# Patient Record
Sex: Female | Born: 1985 | Race: Black or African American | Hispanic: No | Marital: Married | Smoking: Never smoker
Health system: Southern US, Community
[De-identification: ages and names within clinical notes are randomized; demographics above are authoritative.]

## PROBLEM LIST (undated history)

## (undated) ENCOUNTER — Inpatient Hospital Stay (HOSPITAL_COMMUNITY): Payer: Self-pay

## (undated) ENCOUNTER — Emergency Department (HOSPITAL_COMMUNITY): Admission: EM | Payer: Self-pay | Source: Home / Self Care

## (undated) DIAGNOSIS — D219 Benign neoplasm of connective and other soft tissue, unspecified: Secondary | ICD-10-CM

## (undated) DIAGNOSIS — B977 Papillomavirus as the cause of diseases classified elsewhere: Secondary | ICD-10-CM

## (undated) DIAGNOSIS — R87629 Unspecified abnormal cytological findings in specimens from vagina: Secondary | ICD-10-CM

## (undated) DIAGNOSIS — Z141 Cystic fibrosis carrier: Secondary | ICD-10-CM

## (undated) DIAGNOSIS — K219 Gastro-esophageal reflux disease without esophagitis: Secondary | ICD-10-CM

## (undated) HISTORY — DX: Cystic fibrosis carrier: Z14.1

## (undated) HISTORY — PX: NO PAST SURGERIES: SHX2092

---

## 2009-07-28 ENCOUNTER — Emergency Department (HOSPITAL_COMMUNITY): Admission: EM | Admit: 2009-07-28 | Discharge: 2009-07-28 | Payer: Self-pay | Admitting: Emergency Medicine

## 2012-11-29 LAB — OB RESULTS CONSOLE ABO/RH: RH Type: POSITIVE

## 2012-11-29 LAB — OB RESULTS CONSOLE VARICELLA ZOSTER ANTIBODY, IGG: Varicella: IMMUNE

## 2012-11-29 LAB — OB RESULTS CONSOLE ANTIBODY SCREEN: Antibody Screen: NEGATIVE

## 2012-11-29 LAB — OB RESULTS CONSOLE HGB/HCT, BLOOD
HCT: 41 %
Hemoglobin: 13.7 g/dL

## 2012-11-29 LAB — OB RESULTS CONSOLE PLATELET COUNT: Platelets: 281 10*3/uL

## 2012-12-13 LAB — CYSTIC FIBROSIS DIAGNOSTIC STUDY

## 2012-12-27 DIAGNOSIS — Z141 Cystic fibrosis carrier: Secondary | ICD-10-CM

## 2012-12-27 HISTORY — DX: Cystic fibrosis carrier: Z14.1

## 2013-03-18 ENCOUNTER — Encounter (HOSPITAL_COMMUNITY): Payer: Self-pay | Admitting: *Deleted

## 2013-03-18 ENCOUNTER — Emergency Department (HOSPITAL_COMMUNITY)
Admission: EM | Admit: 2013-03-18 | Discharge: 2013-03-18 | Disposition: A | Discharge status: Home / Self Care | Payer: Medicaid Other | Attending: Emergency Medicine | Admitting: Emergency Medicine

## 2013-03-18 DIAGNOSIS — N760 Acute vaginitis: Secondary | ICD-10-CM | POA: Insufficient documentation

## 2013-03-18 DIAGNOSIS — B9689 Other specified bacterial agents as the cause of diseases classified elsewhere: Secondary | ICD-10-CM

## 2013-03-18 DIAGNOSIS — O4703 False labor before 37 completed weeks of gestation, third trimester: Secondary | ICD-10-CM

## 2013-03-18 DIAGNOSIS — O9989 Other specified diseases and conditions complicating pregnancy, childbirth and the puerperium: Secondary | ICD-10-CM | POA: Insufficient documentation

## 2013-03-18 HISTORY — DX: Benign neoplasm of connective and other soft tissue, unspecified: D21.9

## 2013-03-18 LAB — URINALYSIS, ROUTINE W REFLEX MICROSCOPIC
Glucose, UA: NEGATIVE mg/dL
Ketones, ur: NEGATIVE mg/dL
Leukocytes, UA: NEGATIVE
Protein, ur: NEGATIVE mg/dL
Urobilinogen, UA: 0.2 mg/dL (ref 0.0–1.0)

## 2013-03-18 LAB — URINE MICROSCOPIC-ADD ON

## 2013-03-18 LAB — GC/CHLAMYDIA PROBE AMP: CT Probe RNA: NEGATIVE

## 2013-03-18 LAB — WET PREP, GENITAL: Yeast Wet Prep HPF POC: NONE SEEN

## 2013-03-18 MED ORDER — LACTATED RINGERS IV BOLUS (SEPSIS)
1000.0000 mL | Freq: Once | INTRAVENOUS | Status: AC
  Administered 2013-03-18: 1000 mL via INTRAVENOUS

## 2013-03-18 MED ORDER — METRONIDAZOLE 500 MG PO TABS: 500.0000 mg | ORAL_TABLET | Freq: Two times a day (BID) | ORAL | Status: DC

## 2013-03-18 MED ORDER — METRONIDAZOLE 500 MG PO TABS
500.0000 mg | ORAL_TABLET | Freq: Once | ORAL | Status: AC
  Administered 2013-03-18: 500 mg via ORAL
  Filled 2013-03-18: qty 1

## 2013-03-18 NOTE — ED Notes (Signed)
Ob rn at bedside on fetal monitor.  Iv lr added to saline lok

## 2013-03-18 NOTE — ED Provider Notes (Signed)
CSN: [id number]     Arrival date & time 03/18/13  0434 History     First MD Initiated Contact with Patient 03/18/13 0439     No chief complaint on file.  (Consider location/radiation/quality/duration/timing/severity/associated sxs/prior Treatment) HPI 27 yo female presents to the ER with complaint of abdominal cramping.  Pt is G1P0 at 27 weeks.  She has recently moved to [facility] as of July 1, does not have local OB yet, awaiting call back from [facility].  UTI about a month ago, no symptoms now.  No back pain.  No vaginal bleeding or d/c.  Pt takes daily aspirin for "low blood flow to placenta", also on Vit D.  No other complications during this pregnancy.    No past medical history on file. No past surgical history on file. No family history on file. History  Substance Use Topics  . Smoking status: Not on file  . Smokeless tobacco: Not on file  . Alcohol Use: Not on file   OB History   No data available     Review of Systems  All other systems reviewed and are negative.    Allergies  Review of patient's allergies indicates not on file.  Home Medications  No current outpatient prescriptions on file. BP 109/63  Pulse 83  Temp(Src) 98.9 F (37.2 C) (Oral)  Resp 18  SpO2 96% Physical Exam  Nursing note and vitals reviewed. Constitutional: She is oriented to person, place, and time. She appears well-developed and well-nourished. She appears distressed (tearful, anxious).  HENT:  Head: Normocephalic and atraumatic.  Nose: Nose normal.  Mouth/Throat: Oropharynx is clear and moist.  Eyes: Conjunctivae and EOM are normal. Pupils are equal, round, and reactive to light.  Neck: Normal range of motion. Neck supple. No JVD present. No tracheal deviation present. No thyromegaly present.  Cardiovascular: Normal rate, regular rhythm, normal heart sounds and intact distal pulses.  Exam reveals no gallop and no friction rub.   No murmur heard. Pulmonary/Chest: Effort normal  and breath sounds normal. No stridor. No respiratory distress. She has no wheezes. She has no rales. She exhibits no tenderness.  Abdominal: Soft. Bowel sounds are normal. She exhibits no distension and no mass. There is no tenderness. There is no rebound and no guarding.  Gravid uterus, non tender, good fetal movement appreciated.  Genitourinary:  External genitalia normal Vagina with thick white discharge Cervix closed no lesions, thick No cervical motion tenderness Gravid uterus c/w dates    Musculoskeletal: Normal range of motion. She exhibits no edema and no tenderness.  Lymphadenopathy:    She has no cervical adenopathy.  Neurological: She is alert and oriented to person, place, and time. She exhibits normal muscle tone. Coordination normal.  Skin: Skin is warm and dry. No rash noted. No erythema. No pallor.  Psychiatric: She has a normal mood and affect. Her behavior is normal. Judgment and thought content normal.    ED Course   Procedures (including critical care time)  Labs Reviewed  WET PREP, GENITAL - Abnormal; Notable for the following:    Clue Cells Wet Prep HPF POC FEW (*)    WBC, Wet Prep HPF POC FEW (*)    All other components within normal limits  URINALYSIS, ROUTINE W REFLEX MICROSCOPIC - Abnormal; Notable for the following:    Color, Urine STRAW (*)    Hgb urine dipstick TRACE (*)    All other components within normal limits  URINE MICROSCOPIC-ADD ON - Abnormal; Notable for the  following:    Squamous Epithelial / LPF FEW (*)    Bacteria, UA MANY (*)    All other components within normal limits  GC/CHLAMYDIA PROBE AMP  URINE CULTURE   No results found. 1. Bacterial vaginosis   2. Preterm contractions, third trimester     MDM  27 yo female with abdominal cramping during pregnancy.  Differential includes preterm labor, braxton hicks.  Will check UA, get toco strip, FHT.  6:13 AM Pt having some uterine irritability on toco, no strong contractions.  To  have ivf for possible dehydration.  Pelvic exam completed, possible yeast infection, will sent for wet prep.  Olivia Mackie, MD 03/18/13 [phone]

## 2013-03-18 NOTE — ED Notes (Signed)
Urine not resulted   It was sent almost 2 hours ago.  The  Lab is just now  Running it

## 2013-03-18 NOTE — Progress Notes (Signed)
RROB spoke with Dr Jacquelynn Cree attending) about pt; lab findings, sve, fhr and with ui; Dr agrees with plan of care-finish bolus of fluid, treat pt for BV, pt to be d/c'd to home.

## 2013-03-18 NOTE — ED Notes (Signed)
The pt just moved here from [facility]  She is 7 months preg.  Lmp jan 4th. edc oct 22.  She has had lower abd pain for one  Week no bleeding vaginally .  She has had some sl nausea.  No acute distress at present.  Iv placed.  Ob rapid response called

## 2013-03-19 LAB — URINE CULTURE

## 2013-03-26 ENCOUNTER — Encounter: Payer: Self-pay | Admitting: *Deleted

## 2013-03-29 ENCOUNTER — Encounter: Payer: Self-pay | Admitting: Obstetrics & Gynecology

## 2013-03-30 ENCOUNTER — Encounter: Payer: Self-pay | Admitting: General Practice

## 2013-04-03 ENCOUNTER — Encounter: Payer: Self-pay | Admitting: Obstetrics

## 2013-04-03 ENCOUNTER — Ambulatory Visit (INDEPENDENT_AMBULATORY_CARE_PROVIDER_SITE_OTHER): Payer: Medicaid Other | Admitting: Obstetrics

## 2013-04-03 VITALS — BP 107/62 | Temp 97.8°F | Wt 163.0 lb

## 2013-04-03 DIAGNOSIS — Z3403 Encounter for supervision of normal first pregnancy, third trimester: Secondary | ICD-10-CM

## 2013-04-03 DIAGNOSIS — Z3400 Encounter for supervision of normal first pregnancy, unspecified trimester: Secondary | ICD-10-CM

## 2013-04-03 LAB — POCT URINALYSIS DIPSTICK
Glucose, UA: NEGATIVE
Leukocytes, UA: NEGATIVE
Nitrite, UA: NEGATIVE
Protein, UA: NEGATIVE
Urobilinogen, UA: NEGATIVE

## 2013-04-03 NOTE — Addendum Note (Signed)
Addended by: Julaine Hua on: 04/03/2013 01:28 PM   Modules accepted: Orders

## 2013-04-03 NOTE — Progress Notes (Signed)
Pulse-76   Subjective:    Kim [name] is being seen today for her first obstetrical visit.  This is not a planned pregnancy. She is at [redacted]w[redacted]d gestation. Her obstetrical history is significant for none. Relationship with FOB: spouse, living together. Patient does intend to breast feed. Pregnancy history fully reviewed.  Menstrual History: OB History   Grav Para Term Preterm Abortions TAB SAB Ect Mult Living   1               Menarche age: 27  Patient's last menstrual period was 09/18/2012.    The following portions of the patient's history were reviewed and updated as appropriate: allergies, current medications, past family history, past medical history, past social history, past surgical history and problem list.  Review of Systems Pertinent items are noted in HPI.    Objective:    General appearance: alert and no distress Breasts: normal appearance, no masses or tenderness Abdomen: normal findings: soft, non-tender Pelvic: cervix normal in appearance, external genitalia normal, no adnexal masses or tenderness, no cervical motion tenderness, vagina normal without discharge and uterus enlarged, soft and NT. Extremities: extremities normal, atraumatic, no cyanosis or edema    Assessment:    Pregnancy at [redacted]w[redacted]d weeks    Plan:    Initial labs drawn. Prenatal vitamins.  Counseling provided regarding continued use of seat belts, cessation of alcohol consumption, smoking or use of illicit drugs; infection precautions i.e., influenza/TDAP immunizations, toxoplasmosis,CMV, parvovirus, listeria and varicella; workplace safety, exercise during pregnancy; routine dental care, safe medications, sexual activity, hot tubs, saunas, pools, travel, caffeine use, fish and methlymercury, potential toxins, hair treatments, varicose veins Weight gain recommendations reviewed: underweight/BMI< 18.5--> gain 28 - 40 lbs; normal weight/BMI 18.5 - 24.9--> gain 25 - 35 lbs; overweight/BMI 25 - 29.9-->  gain 15 - 25 lbs; obese/BMI >30->gain  11 - 20 lbs Problem list reviewed and updated. AFP3 discussed: done. Role of ultrasound in pregnancy discussed; fetal survey: done. Amniocentesis discussed: not indicated. Follow up in 2 weeks. 50% of 20 min visit spent on counseling and coordination of care.

## 2013-04-04 ENCOUNTER — Encounter: Payer: Self-pay | Admitting: Obstetrics & Gynecology

## 2013-04-05 ENCOUNTER — Encounter: Payer: Medicaid Other | Admitting: Obstetrics & Gynecology

## 2013-04-05 LAB — URINE CULTURE: Colony Count: 4000

## 2013-04-18 ENCOUNTER — Encounter: Payer: Self-pay | Admitting: Obstetrics

## 2013-04-18 ENCOUNTER — Encounter: Payer: Medicaid Other | Admitting: Obstetrics

## 2013-04-24 ENCOUNTER — Encounter: Payer: Medicaid Other | Admitting: Obstetrics

## 2013-04-25 ENCOUNTER — Ambulatory Visit (INDEPENDENT_AMBULATORY_CARE_PROVIDER_SITE_OTHER): Payer: Medicaid Other | Admitting: Obstetrics

## 2013-04-25 VITALS — BP 115/78 | Temp 98.5°F | Wt 165.6 lb

## 2013-04-25 DIAGNOSIS — Z3400 Encounter for supervision of normal first pregnancy, unspecified trimester: Secondary | ICD-10-CM

## 2013-04-25 DIAGNOSIS — K219 Gastro-esophageal reflux disease without esophagitis: Secondary | ICD-10-CM

## 2013-04-25 DIAGNOSIS — Z3403 Encounter for supervision of normal first pregnancy, third trimester: Secondary | ICD-10-CM

## 2013-04-25 MED ORDER — OMEPRAZOLE 20 MG PO CPDR: 20.0000 mg | DELAYED_RELEASE_CAPSULE | Freq: Every day | ORAL | Status: DC

## 2013-04-25 NOTE — Progress Notes (Signed)
Pulse: 93

## 2013-04-26 ENCOUNTER — Encounter: Payer: Self-pay | Admitting: Obstetrics

## 2013-04-26 ENCOUNTER — Encounter: Payer: Self-pay | Admitting: Obstetrics & Gynecology

## 2013-04-26 DIAGNOSIS — K219 Gastro-esophageal reflux disease without esophagitis: Secondary | ICD-10-CM | POA: Insufficient documentation

## 2013-04-30 ENCOUNTER — Encounter: Payer: Medicaid Other | Admitting: Obstetrics

## 2013-05-01 ENCOUNTER — Encounter: Payer: Self-pay | Admitting: Obstetrics

## 2013-05-01 ENCOUNTER — Encounter: Payer: Medicaid Other | Admitting: Obstetrics

## 2013-05-01 ENCOUNTER — Ambulatory Visit (INDEPENDENT_AMBULATORY_CARE_PROVIDER_SITE_OTHER): Payer: Medicaid Other | Admitting: Obstetrics

## 2013-05-01 VITALS — BP 110/72 | Temp 98.4°F | Wt 165.8 lb

## 2013-05-01 DIAGNOSIS — Z3403 Encounter for supervision of normal first pregnancy, third trimester: Secondary | ICD-10-CM

## 2013-05-01 DIAGNOSIS — Z3400 Encounter for supervision of normal first pregnancy, unspecified trimester: Secondary | ICD-10-CM

## 2013-05-01 NOTE — Progress Notes (Signed)
Patient left before being seen by physician.

## 2013-05-01 NOTE — Progress Notes (Signed)
Pulse: 84

## 2013-05-03 ENCOUNTER — Ambulatory Visit (INDEPENDENT_AMBULATORY_CARE_PROVIDER_SITE_OTHER): Payer: Medicaid Other | Admitting: Obstetrics

## 2013-05-03 ENCOUNTER — Encounter: Payer: Self-pay | Admitting: Obstetrics

## 2013-05-03 VITALS — BP 111/74 | Temp 98.1°F | Wt 167.0 lb

## 2013-05-03 DIAGNOSIS — Z369 Encounter for antenatal screening, unspecified: Secondary | ICD-10-CM

## 2013-05-03 DIAGNOSIS — Z3401 Encounter for supervision of normal first pregnancy, first trimester: Secondary | ICD-10-CM

## 2013-05-03 DIAGNOSIS — Z3400 Encounter for supervision of normal first pregnancy, unspecified trimester: Secondary | ICD-10-CM

## 2013-05-03 NOTE — Addendum Note (Signed)
Addended by: Glendell Docker on: 05/03/2013 11:44 AM   Modules accepted: Orders

## 2013-05-03 NOTE — Progress Notes (Signed)
P 78 Patient states she has some pressure at times with the baby's movement. Patient has hemorrhoids is using  OTC medications.

## 2013-05-08 ENCOUNTER — Other Ambulatory Visit: Payer: Self-pay | Admitting: Obstetrics

## 2013-05-08 ENCOUNTER — Ambulatory Visit (HOSPITAL_COMMUNITY)
Admission: RE | Admit: 2013-05-08 | Discharge: 2013-05-08 | Disposition: A | Discharge status: Home / Self Care | Payer: Medicaid Other | Source: Ambulatory Visit | Attending: Obstetrics | Admitting: Obstetrics

## 2013-05-08 ENCOUNTER — Other Ambulatory Visit (HOSPITAL_COMMUNITY): Payer: Self-pay | Admitting: Maternal and Fetal Medicine

## 2013-05-08 ENCOUNTER — Encounter: Payer: Medicaid Other | Admitting: Obstetrics

## 2013-05-08 DIAGNOSIS — Z369 Encounter for antenatal screening, unspecified: Secondary | ICD-10-CM

## 2013-05-08 DIAGNOSIS — Z3689 Encounter for other specified antenatal screening: Secondary | ICD-10-CM | POA: Insufficient documentation

## 2013-05-08 NOTE — Progress Notes (Signed)
Kim [name]  was seen today for an ultrasound appointment.  See full report in [facility].  Impression: Single IUP at 35 1/7 weeks Somewhat limited views of the fetal anatomy obtained due to late gestational age (outflow tracks, face, spine) The estimated fetal weight today is at the 48th %tile; however, the [facility] measures at the 5th %tile. BPP 8/8 Elevated UA Dopplers noted (> 97.5th %tile) Normal amniotic fluid volume  Recommendations: Based on lagging [facility] and elevated UA Dopplers, recommend 2x weekly NSTs with weekly AFIs/ UA Dopplers. Findings and recommendations discussed with Dr. Clearance Coots who concurs.  Alpha Gula, MD

## 2013-05-10 ENCOUNTER — Other Ambulatory Visit: Payer: Self-pay | Admitting: Obstetrics

## 2013-05-10 DIAGNOSIS — IMO0002 Reserved for concepts with insufficient information to code with codable children: Secondary | ICD-10-CM

## 2013-05-11 ENCOUNTER — Ambulatory Visit (INDEPENDENT_AMBULATORY_CARE_PROVIDER_SITE_OTHER): Payer: Medicaid Other | Admitting: Advanced Practice Midwife

## 2013-05-11 ENCOUNTER — Ambulatory Visit (HOSPITAL_COMMUNITY)
Admission: RE | Admit: 2013-05-11 | Discharge: 2013-05-11 | Disposition: A | Discharge status: Home / Self Care | Payer: Medicaid Other | Source: Ambulatory Visit | Attending: Maternal and Fetal Medicine | Admitting: Maternal and Fetal Medicine

## 2013-05-11 ENCOUNTER — Ambulatory Visit (HOSPITAL_COMMUNITY)
Admission: RE | Admit: 2013-05-11 | Discharge: 2013-05-11 | Disposition: A | Discharge status: Home / Self Care | Payer: Medicaid Other | Source: Ambulatory Visit | Attending: Obstetrics | Admitting: Obstetrics

## 2013-05-11 ENCOUNTER — Other Ambulatory Visit (HOSPITAL_COMMUNITY): Payer: Self-pay | Admitting: Maternal and Fetal Medicine

## 2013-05-11 VITALS — BP 114/73 | Temp 98.7°F | Wt 170.0 lb

## 2013-05-11 DIAGNOSIS — Z3689 Encounter for other specified antenatal screening: Secondary | ICD-10-CM | POA: Insufficient documentation

## 2013-05-11 DIAGNOSIS — O365990 Maternal care for other known or suspected poor fetal growth, unspecified trimester, not applicable or unspecified: Secondary | ICD-10-CM | POA: Insufficient documentation

## 2013-05-11 DIAGNOSIS — Z3400 Encounter for supervision of normal first pregnancy, unspecified trimester: Secondary | ICD-10-CM

## 2013-05-11 NOTE — Progress Notes (Signed)
Patient left prior to appt to go to work. Will reschedule. P 92 Patient states she has RLQ pain at times. Patient states she had an episode of white discharge, but no accompanying itching or irritation.

## 2013-05-15 ENCOUNTER — Encounter: Payer: Self-pay | Admitting: Obstetrics

## 2013-05-15 ENCOUNTER — Ambulatory Visit (HOSPITAL_COMMUNITY)
Admission: RE | Admit: 2013-05-15 | Discharge: 2013-05-15 | Disposition: A | Discharge status: Home / Self Care | Payer: Medicaid Other | Source: Ambulatory Visit | Attending: Obstetrics | Admitting: Obstetrics

## 2013-05-15 ENCOUNTER — Other Ambulatory Visit: Payer: Self-pay | Admitting: Obstetrics

## 2013-05-15 ENCOUNTER — Ambulatory Visit (INDEPENDENT_AMBULATORY_CARE_PROVIDER_SITE_OTHER): Payer: Medicaid Other | Admitting: Obstetrics

## 2013-05-15 VITALS — BP 112/75 | Temp 98.3°F | Wt 169.6 lb

## 2013-05-15 VITALS — BP 124/81 | HR 92 | Wt 171.0 lb

## 2013-05-15 DIAGNOSIS — O365990 Maternal care for other known or suspected poor fetal growth, unspecified trimester, not applicable or unspecified: Secondary | ICD-10-CM | POA: Insufficient documentation

## 2013-05-15 DIAGNOSIS — IMO0002 Reserved for concepts with insufficient information to code with codable children: Secondary | ICD-10-CM

## 2013-05-15 DIAGNOSIS — Z3400 Encounter for supervision of normal first pregnancy, unspecified trimester: Secondary | ICD-10-CM

## 2013-05-15 DIAGNOSIS — Z3403 Encounter for supervision of normal first pregnancy, third trimester: Secondary | ICD-10-CM

## 2013-05-15 LAB — POCT URINALYSIS DIPSTICK
Blood, UA: NEGATIVE
Protein, UA: NEGATIVE
Spec Grav, UA: <=1.005
Urobilinogen, UA: NEGATIVE
pH, UA: 8

## 2013-05-15 NOTE — Progress Notes (Signed)
P 90 Patient was seen at [facility] today for [place] and monitor- patient reports everything normal. Patient states she has a "pinching" pain when she has [place] on R side of abdomen.

## 2013-05-15 NOTE — Progress Notes (Signed)
Kim [name]  was seen today for an ultrasound appointment.  See full report in [facility].  Impression: Single IUP at 35 6/7 weeks Normal UA Doppler studies for gestional age Normal amniotic fluid volume (AFI 20.6 cm) Fetus is Homero Fellers Breech presentation  Recommendations: Continue 2x weekly NSTs with weekly AFI To discuss route of delivery (ECV vs. scheduled C-section) at next OB visit. Follow up ultrasound for growth in 2 weeks.  Alpha Gula, MD

## 2013-05-18 ENCOUNTER — Ambulatory Visit (HOSPITAL_COMMUNITY)
Admission: RE | Admit: 2013-05-18 | Discharge: 2013-05-18 | Disposition: A | Discharge status: Home / Self Care | Payer: Medicaid Other | Source: Ambulatory Visit | Attending: Obstetrics | Admitting: Obstetrics

## 2013-05-18 ENCOUNTER — Other Ambulatory Visit: Payer: Self-pay | Admitting: Obstetrics

## 2013-05-18 ENCOUNTER — Encounter: Payer: Self-pay | Admitting: Obstetrics

## 2013-05-18 DIAGNOSIS — O288 Other abnormal findings on antenatal screening of mother: Secondary | ICD-10-CM

## 2013-05-18 DIAGNOSIS — O365990 Maternal care for other known or suspected poor fetal growth, unspecified trimester, not applicable or unspecified: Secondary | ICD-10-CM | POA: Insufficient documentation

## 2013-05-18 DIAGNOSIS — O289 Unspecified abnormal findings on antenatal screening of mother: Secondary | ICD-10-CM | POA: Insufficient documentation

## 2013-05-18 NOTE — ED Notes (Signed)
Fetal tracing not loading into obix.  Will scan tracing into pts chart when complete.

## 2013-05-21 ENCOUNTER — Ambulatory Visit (HOSPITAL_COMMUNITY): Payer: Medicaid Other

## 2013-05-22 ENCOUNTER — Encounter (HOSPITAL_COMMUNITY): Payer: Self-pay

## 2013-05-22 ENCOUNTER — Other Ambulatory Visit: Payer: Self-pay

## 2013-05-22 ENCOUNTER — Other Ambulatory Visit (HOSPITAL_COMMUNITY): Payer: Self-pay | Admitting: Maternal and Fetal Medicine

## 2013-05-22 ENCOUNTER — Ambulatory Visit (HOSPITAL_COMMUNITY)
Admission: RE | Admit: 2013-05-22 | Discharge: 2013-05-22 | Disposition: A | Discharge status: Home / Self Care | Payer: Medicaid Other | Source: Ambulatory Visit | Attending: Obstetrics | Admitting: Obstetrics

## 2013-05-22 ENCOUNTER — Ambulatory Visit (INDEPENDENT_AMBULATORY_CARE_PROVIDER_SITE_OTHER): Payer: Medicaid Other | Admitting: Obstetrics

## 2013-05-22 VITALS — BP 114/76 | Temp 97.9°F | Wt 169.4 lb

## 2013-05-22 DIAGNOSIS — O321XX0 Maternal care for breech presentation, not applicable or unspecified: Secondary | ICD-10-CM

## 2013-05-22 DIAGNOSIS — O321XX1 Maternal care for breech presentation, fetus 1: Secondary | ICD-10-CM

## 2013-05-22 DIAGNOSIS — Z3403 Encounter for supervision of normal first pregnancy, third trimester: Secondary | ICD-10-CM

## 2013-05-22 DIAGNOSIS — O365990 Maternal care for other known or suspected poor fetal growth, unspecified trimester, not applicable or unspecified: Secondary | ICD-10-CM | POA: Insufficient documentation

## 2013-05-22 DIAGNOSIS — O365931 Maternal care for other known or suspected poor fetal growth, third trimester, fetus 1: Secondary | ICD-10-CM

## 2013-05-22 DIAGNOSIS — IMO0002 Reserved for concepts with insufficient information to code with codable children: Secondary | ICD-10-CM

## 2013-05-22 DIAGNOSIS — Z3689 Encounter for other specified antenatal screening: Secondary | ICD-10-CM | POA: Insufficient documentation

## 2013-05-22 DIAGNOSIS — Z3400 Encounter for supervision of normal first pregnancy, unspecified trimester: Secondary | ICD-10-CM

## 2013-05-22 LAB — POCT URINALYSIS DIPSTICK
Bilirubin, UA: NEGATIVE
Ketones, UA: NEGATIVE
Leukocytes, UA: NEGATIVE
Nitrite, UA: NEGATIVE
Protein, UA: NEGATIVE
Urobilinogen, UA: NEGATIVE
pH, UA: 5

## 2013-05-22 NOTE — Progress Notes (Signed)
Pulse: 88

## 2013-05-25 ENCOUNTER — Ambulatory Visit (HOSPITAL_COMMUNITY): Admission: RE | Admit: 2013-05-25 | Payer: Medicaid Other | Source: Ambulatory Visit

## 2013-05-25 ENCOUNTER — Other Ambulatory Visit: Payer: Self-pay | Admitting: Obstetrics

## 2013-05-25 DIAGNOSIS — IMO0002 Reserved for concepts with insufficient information to code with codable children: Secondary | ICD-10-CM

## 2013-05-29 ENCOUNTER — Ambulatory Visit (HOSPITAL_COMMUNITY): Payer: Medicaid Other

## 2013-05-29 ENCOUNTER — Encounter: Payer: Self-pay | Admitting: Obstetrics

## 2013-05-29 ENCOUNTER — Ambulatory Visit (INDEPENDENT_AMBULATORY_CARE_PROVIDER_SITE_OTHER): Payer: Medicaid Other | Admitting: Obstetrics

## 2013-05-29 ENCOUNTER — Other Ambulatory Visit: Payer: Self-pay | Admitting: Obstetrics

## 2013-05-29 ENCOUNTER — Ambulatory Visit (HOSPITAL_COMMUNITY)
Admission: RE | Admit: 2013-05-29 | Discharge: 2013-05-29 | Disposition: A | Discharge status: Home / Self Care | Payer: Medicaid Other | Source: Ambulatory Visit | Attending: Obstetrics | Admitting: Obstetrics

## 2013-05-29 VITALS — BP 111/78 | Temp 98.2°F | Wt 168.8 lb

## 2013-05-29 DIAGNOSIS — IMO0002 Reserved for concepts with insufficient information to code with codable children: Secondary | ICD-10-CM

## 2013-05-29 DIAGNOSIS — Z3403 Encounter for supervision of normal first pregnancy, third trimester: Secondary | ICD-10-CM

## 2013-05-29 DIAGNOSIS — Z3400 Encounter for supervision of normal first pregnancy, unspecified trimester: Secondary | ICD-10-CM

## 2013-05-29 DIAGNOSIS — O365990 Maternal care for other known or suspected poor fetal growth, unspecified trimester, not applicable or unspecified: Secondary | ICD-10-CM | POA: Insufficient documentation

## 2013-05-29 LAB — POCT URINALYSIS DIPSTICK
Blood, UA: NEGATIVE
Glucose, UA: NEGATIVE
Nitrite, UA: NEGATIVE
Protein, UA: NEGATIVE
Spec Grav, UA: 1.015
Urobilinogen, UA: NEGATIVE
pH, UA: 8

## 2013-05-29 NOTE — Progress Notes (Signed)
Pulse: 76

## 2013-05-30 ENCOUNTER — Encounter: Payer: Self-pay | Admitting: *Deleted

## 2013-05-30 ENCOUNTER — Other Ambulatory Visit: Payer: Self-pay | Admitting: *Deleted

## 2013-06-01 ENCOUNTER — Ambulatory Visit (HOSPITAL_COMMUNITY): Payer: Medicaid Other

## 2013-06-04 ENCOUNTER — Other Ambulatory Visit: Payer: Self-pay | Admitting: Obstetrics

## 2013-06-04 DIAGNOSIS — IMO0002 Reserved for concepts with insufficient information to code with codable children: Secondary | ICD-10-CM

## 2013-06-05 ENCOUNTER — Encounter (HOSPITAL_COMMUNITY): Payer: Self-pay

## 2013-06-05 ENCOUNTER — Other Ambulatory Visit: Payer: Self-pay | Admitting: *Deleted

## 2013-06-05 ENCOUNTER — Ambulatory Visit (HOSPITAL_COMMUNITY)
Admission: RE | Admit: 2013-06-05 | Discharge: 2013-06-05 | Disposition: A | Discharge status: Home / Self Care | Payer: Medicaid Other | Source: Ambulatory Visit | Attending: Obstetrics | Admitting: Obstetrics

## 2013-06-05 ENCOUNTER — Encounter (HOSPITAL_COMMUNITY)
Admission: RE | Admit: 2013-06-05 | Discharge: 2013-06-05 | Disposition: A | Discharge status: Home / Self Care | Payer: Medicaid Other | Source: Ambulatory Visit | Attending: Obstetrics | Admitting: Obstetrics

## 2013-06-05 ENCOUNTER — Encounter: Payer: Self-pay | Admitting: Obstetrics

## 2013-06-05 ENCOUNTER — Encounter (HOSPITAL_COMMUNITY): Payer: Self-pay | Admitting: Pharmacy Technician

## 2013-06-05 ENCOUNTER — Ambulatory Visit (INDEPENDENT_AMBULATORY_CARE_PROVIDER_SITE_OTHER): Payer: Medicaid Other | Admitting: Obstetrics

## 2013-06-05 ENCOUNTER — Encounter: Payer: Self-pay | Admitting: *Deleted

## 2013-06-05 VITALS — BP 118/79 | Temp 97.4°F | Wt 171.0 lb

## 2013-06-05 DIAGNOSIS — Z3400 Encounter for supervision of normal first pregnancy, unspecified trimester: Secondary | ICD-10-CM

## 2013-06-05 DIAGNOSIS — O365990 Maternal care for other known or suspected poor fetal growth, unspecified trimester, not applicable or unspecified: Secondary | ICD-10-CM | POA: Insufficient documentation

## 2013-06-05 DIAGNOSIS — Z3403 Encounter for supervision of normal first pregnancy, third trimester: Secondary | ICD-10-CM

## 2013-06-05 DIAGNOSIS — IMO0002 Reserved for concepts with insufficient information to code with codable children: Secondary | ICD-10-CM

## 2013-06-05 HISTORY — DX: Gastro-esophageal reflux disease without esophagitis: K21.9

## 2013-06-05 LAB — CBC
Hemoglobin: 12.5 g/dL (ref 12.0–15.0)
MCH: 31.4 pg (ref 26.0–34.0)
MCV: 91.2 fL (ref 78.0–100.0)
Platelets: 215 10*3/uL (ref 150–400)
RDW: 13.8 % (ref 11.5–15.5)

## 2013-06-05 NOTE — Patient Instructions (Addendum)
Your procedure is scheduled on: 06/07/2013  Enter through the Maternity Admission of [facility] at: 0600AM  Pick up the phone at the desk and dial [phone].  Call this number if you have problems the morning of surgery: [phone].  Remember: Do NOT eat food:  AFTER MIDNIGHT 06/06/2013 Do NOT drink clear liquids after:  AFTER MIDNIGHT 06/06/2013 Take these medicines the morning of surgery with a SIP OF WATER: PRILOSEC  Do NOT wear jewelry (body piercing), make-up, or nail polish. Do NOT wear lotions, powders, or perfumes.  You may wear deoderant. Do NOT shave for 48 hours prior to surgery. Do NOT bring valuables to the hospital. Contacts, dentures, or bridgework may not be worn into surgery. Leave suitcase in car.  After surgery it may be brought to your room.  For patients admitted to the hospital, checkout time is 11:00 AM the day of discharge.

## 2013-06-05 NOTE — Progress Notes (Signed)
Pulse- 80 

## 2013-06-07 ENCOUNTER — Inpatient Hospital Stay (HOSPITAL_COMMUNITY): Payer: Medicaid Other | Admitting: Anesthesiology

## 2013-06-07 ENCOUNTER — Encounter (HOSPITAL_COMMUNITY): Payer: Self-pay | Admitting: Anesthesiology

## 2013-06-07 ENCOUNTER — Inpatient Hospital Stay (HOSPITAL_COMMUNITY)
Admission: RE | Admit: 2013-06-07 | Discharge: 2013-06-09 | DRG: 766 | Disposition: A | Discharge status: Home / Self Care | Payer: Medicaid Other | Source: Ambulatory Visit | Attending: Obstetrics | Admitting: Obstetrics

## 2013-06-07 ENCOUNTER — Encounter (HOSPITAL_COMMUNITY): Payer: Medicaid Other | Admitting: Anesthesiology

## 2013-06-07 ENCOUNTER — Encounter (HOSPITAL_COMMUNITY)
Admission: RE | Disposition: A | Discharge status: Home / Self Care | Payer: Self-pay | Source: Ambulatory Visit | Attending: Obstetrics

## 2013-06-07 DIAGNOSIS — O321XX0 Maternal care for breech presentation, not applicable or unspecified: Principal | ICD-10-CM | POA: Diagnosis present

## 2013-06-07 SURGERY — Surgical Case
Anesthesia: Spinal | Site: Abdomen | Wound class: Clean Contaminated

## 2013-06-07 MED ORDER — LACTATED RINGERS IV SOLN
INTRAVENOUS | Status: DC
  Administered 2013-06-07 (×2): via INTRAVENOUS

## 2013-06-07 MED ORDER — 0.9 % SODIUM CHLORIDE (POUR BTL) OPTIME
TOPICAL | Status: DC | PRN
  Administered 2013-06-07: 1000 mL

## 2013-06-07 MED ORDER — DIPHENHYDRAMINE HCL 50 MG/ML IJ SOLN
12.5000 mg | INTRAMUSCULAR | Status: DC | PRN
  Administered 2013-06-07: 12.5 mg via INTRAVENOUS
  Filled 2013-06-07: qty 1

## 2013-06-07 MED ORDER — DIPHENHYDRAMINE HCL 25 MG PO CAPS: 25.0000 mg | ORAL_CAPSULE | Freq: Four times a day (QID) | ORAL | Status: DC | PRN

## 2013-06-07 MED ORDER — OXYCODONE-ACETAMINOPHEN 5-325 MG PO TABS
1.0000 | ORAL_TABLET | ORAL | Status: DC | PRN
  Administered 2013-06-07 – 2013-06-08 (×3): 2 via ORAL
  Administered 2013-06-08: 1 via ORAL
  Administered 2013-06-08 – 2013-06-09 (×4): 2 via ORAL
  Filled 2013-06-07: qty 2
  Filled 2013-06-07: qty 1
  Filled 2013-06-07 (×6): qty 2

## 2013-06-07 MED ORDER — NALBUPHINE SYRINGE 5 MG/0.5 ML
5.0000 mg | INJECTION | INTRAMUSCULAR | Status: DC | PRN
  Filled 2013-06-07: qty 1

## 2013-06-07 MED ORDER — SCOPOLAMINE 1 MG/3DAYS TD PT72
1.0000 | MEDICATED_PATCH | Freq: Once | TRANSDERMAL | Status: DC
  Administered 2013-06-07: 1.5 mg via TRANSDERMAL

## 2013-06-07 MED ORDER — PHENYLEPHRINE HCL 10 MG/ML IJ SOLN
INTRAMUSCULAR | Status: AC
  Filled 2013-06-07: qty 1

## 2013-06-07 MED ORDER — PHENYLEPHRINE HCL 10 MG/ML IJ SOLN: 20.0000 mg | INTRAVENOUS | Status: DC | PRN

## 2013-06-07 MED ORDER — SCOPOLAMINE 1 MG/3DAYS TD PT72
MEDICATED_PATCH | TRANSDERMAL | Status: AC
  Administered 2013-06-07: 1.5 mg via TRANSDERMAL
  Filled 2013-06-07: qty 1

## 2013-06-07 MED ORDER — DIPHENHYDRAMINE HCL 25 MG PO CAPS
25.0000 mg | ORAL_CAPSULE | ORAL | Status: DC | PRN
  Administered 2013-06-08 (×2): 25 mg via ORAL
  Filled 2013-06-07 (×2): qty 1

## 2013-06-07 MED ORDER — PROMETHAZINE HCL 25 MG/ML IJ SOLN: 6.2500 mg | INTRAMUSCULAR | Status: DC | PRN

## 2013-06-07 MED ORDER — MORPHINE SULFATE (PF) 0.5 MG/ML IJ SOLN
INTRAMUSCULAR | Status: DC | PRN
  Administered 2013-06-07: .15 mg via INTRATHECAL

## 2013-06-07 MED ORDER — MORPHINE SULFATE 0.5 MG/ML IJ SOLN
INTRAMUSCULAR | Status: AC
  Filled 2013-06-07: qty 10

## 2013-06-07 MED ORDER — SIMETHICONE 80 MG PO CHEW
80.0000 mg | CHEWABLE_TABLET | ORAL | Status: DC | PRN
  Administered 2013-06-08: 80 mg via ORAL
  Filled 2013-06-07: qty 1

## 2013-06-07 MED ORDER — LACTATED RINGERS IV SOLN: Freq: Once | INTRAVENOUS | Status: DC

## 2013-06-07 MED ORDER — ZOLPIDEM TARTRATE 5 MG PO TABS: 5.0000 mg | ORAL_TABLET | Freq: Every evening | ORAL | Status: DC | PRN

## 2013-06-07 MED ORDER — ONDANSETRON HCL 4 MG/2ML IJ SOLN
INTRAMUSCULAR | Status: AC
  Filled 2013-06-07: qty 2

## 2013-06-07 MED ORDER — METOCLOPRAMIDE HCL 5 MG/ML IJ SOLN: 10.0000 mg | Freq: Three times a day (TID) | INTRAMUSCULAR | Status: DC | PRN

## 2013-06-07 MED ORDER — OXYTOCIN 40 UNITS IN LACTATED RINGERS INFUSION - SIMPLE MED: 62.5000 mL/h | INTRAVENOUS | Status: AC

## 2013-06-07 MED ORDER — PHENYLEPHRINE HCL 10 MG/ML IJ SOLN: 10000.0000 ug | INTRAVENOUS | Status: DC | PRN

## 2013-06-07 MED ORDER — PHENYLEPHRINE HCL 10 MG/ML IJ SOLN: 20000.0000 ug | INTRAVENOUS | Status: DC | PRN

## 2013-06-07 MED ORDER — PHENYLEPHRINE 8 MG IN D5W 100 ML (0.08MG/ML) PREMIX OPTIME: INJECTION | INTRAVENOUS | Status: DC | PRN

## 2013-06-07 MED ORDER — MEPERIDINE HCL 25 MG/ML IJ SOLN: 6.2500 mg | INTRAMUSCULAR | Status: DC | PRN

## 2013-06-07 MED ORDER — DIPHENHYDRAMINE HCL 50 MG/ML IJ SOLN: 25.0000 mg | INTRAMUSCULAR | Status: DC | PRN

## 2013-06-07 MED ORDER — PRENATAL MULTIVITAMIN CH
1.0000 | ORAL_TABLET | Freq: Every day | ORAL | Status: DC
  Administered 2013-06-08 – 2013-06-09 (×2): 1 via ORAL
  Filled 2013-06-07 (×2): qty 1

## 2013-06-07 MED ORDER — CLINDAMYCIN PHOSPHATE 900 MG/50ML IV SOLN
900.0000 mg | Freq: Once | INTRAVENOUS | Status: AC
  Administered 2013-06-07: 900 mg via INTRAVENOUS
  Filled 2013-06-07: qty 50

## 2013-06-07 MED ORDER — FENTANYL CITRATE 0.05 MG/ML IJ SOLN
INTRAMUSCULAR | Status: AC
  Filled 2013-06-07: qty 2

## 2013-06-07 MED ORDER — ONDANSETRON HCL 4 MG/2ML IJ SOLN: 4.0000 mg | INTRAMUSCULAR | Status: DC | PRN

## 2013-06-07 MED ORDER — SENNOSIDES-DOCUSATE SODIUM 8.6-50 MG PO TABS
2.0000 | ORAL_TABLET | ORAL | Status: DC
  Administered 2013-06-07 – 2013-06-09 (×2): 2 via ORAL
  Filled 2013-06-07: qty 2

## 2013-06-07 MED ORDER — OXYTOCIN 10 UNIT/ML IJ SOLN
40.0000 [IU] | INTRAVENOUS | Status: DC | PRN
  Administered 2013-06-07: 40 [IU] via INTRAVENOUS

## 2013-06-07 MED ORDER — KETOROLAC TROMETHAMINE 30 MG/ML IJ SOLN
30.0000 mg | Freq: Once | INTRAMUSCULAR | Status: AC
  Administered 2013-06-07: 30 mg via INTRAVENOUS
  Filled 2013-06-07: qty 1

## 2013-06-07 MED ORDER — IBUPROFEN 600 MG PO TABS
600.0000 mg | ORAL_TABLET | Freq: Four times a day (QID) | ORAL | Status: DC
  Administered 2013-06-07 – 2013-06-09 (×7): 600 mg via ORAL
  Filled 2013-06-07 (×7): qty 1

## 2013-06-07 MED ORDER — SIMETHICONE 80 MG PO CHEW
80.0000 mg | CHEWABLE_TABLET | Freq: Three times a day (TID) | ORAL | Status: DC
  Administered 2013-06-08 – 2013-06-09 (×4): 80 mg via ORAL
  Filled 2013-06-07 (×4): qty 1

## 2013-06-07 MED ORDER — DIBUCAINE 1 % RE OINT: 1.0000 "application " | TOPICAL_OINTMENT | RECTAL | Status: DC | PRN

## 2013-06-07 MED ORDER — FENTANYL CITRATE 0.05 MG/ML IJ SOLN
INTRAMUSCULAR | Status: DC | PRN
  Administered 2013-06-07: 25 ug via INTRATHECAL

## 2013-06-07 MED ORDER — OXYCODONE-ACETAMINOPHEN 5-325 MG PO TABS
2.0000 | ORAL_TABLET | Freq: Once | ORAL | Status: AC
  Administered 2013-06-07: 2 via ORAL
  Filled 2013-06-07 (×2): qty 2

## 2013-06-07 MED ORDER — SIMETHICONE 80 MG PO CHEW
80.0000 mg | CHEWABLE_TABLET | ORAL | Status: DC
Start: 2013-06-08 — End: 2013-06-09
  Administered 2013-06-07 – 2013-06-09 (×2): 80 mg via ORAL
  Filled 2013-06-07 (×2): qty 1

## 2013-06-07 MED ORDER — SODIUM CHLORIDE 0.9 % IJ SOLN: 3.0000 mL | INTRAMUSCULAR | Status: DC | PRN

## 2013-06-07 MED ORDER — OXYTOCIN 10 UNIT/ML IJ SOLN
INTRAMUSCULAR | Status: AC
  Filled 2013-06-07: qty 4

## 2013-06-07 MED ORDER — NALBUPHINE SYRINGE 5 MG/0.5 ML
5.0000 mg | INJECTION | INTRAMUSCULAR | Status: DC | PRN
  Administered 2013-06-07 (×2): 5 mg via INTRAVENOUS
  Administered 2013-06-07: 10 mg via INTRAVENOUS
  Administered 2013-06-08 (×2): 5 mg via INTRAVENOUS
  Filled 2013-06-07 (×4): qty 1

## 2013-06-07 MED ORDER — LANOLIN HYDROUS EX OINT: 1.0000 "application " | TOPICAL_OINTMENT | CUTANEOUS | Status: DC | PRN

## 2013-06-07 MED ORDER — HYDROMORPHONE HCL PF 1 MG/ML IJ SOLN: 0.2500 mg | INTRAMUSCULAR | Status: DC | PRN

## 2013-06-07 MED ORDER — NALOXONE HCL 0.4 MG/ML IJ SOLN: 0.4000 mg | INTRAMUSCULAR | Status: DC | PRN

## 2013-06-07 MED ORDER — PHENYLEPHRINE HCL 10 MG/ML IJ SOLN
INTRAMUSCULAR | Status: DC | PRN
  Administered 2013-06-07: 80 ug via INTRAVENOUS

## 2013-06-07 MED ORDER — PHENYLEPHRINE 40 MCG/ML (10ML) SYRINGE FOR IV PUSH (FOR BLOOD PRESSURE SUPPORT)
PREFILLED_SYRINGE | INTRAVENOUS | Status: AC
  Filled 2013-06-07: qty 5

## 2013-06-07 MED ORDER — NALOXONE HCL 1 MG/ML IJ SOLN
1.0000 ug/kg/h | INTRAVENOUS | Status: DC | PRN
  Filled 2013-06-07: qty 2

## 2013-06-07 MED ORDER — ONDANSETRON HCL 4 MG/2ML IJ SOLN
INTRAMUSCULAR | Status: DC | PRN
  Administered 2013-06-07: 4 mg via INTRAMUSCULAR

## 2013-06-07 MED ORDER — ONDANSETRON HCL 4 MG PO TABS: 4.0000 mg | ORAL_TABLET | ORAL | Status: DC | PRN

## 2013-06-07 MED ORDER — TETANUS-DIPHTH-ACELL PERTUSSIS 5-2.5-18.5 LF-MCG/0.5 IM SUSP
0.5000 mL | Freq: Once | INTRAMUSCULAR | Status: AC
  Administered 2013-06-09: 0.5 mL via INTRAMUSCULAR
  Filled 2013-06-07 (×2): qty 0.5

## 2013-06-07 MED ORDER — LACTATED RINGERS IV SOLN
INTRAVENOUS | Status: DC
  Administered 2013-06-07 (×3): via INTRAVENOUS

## 2013-06-07 MED ORDER — PHENYLEPHRINE HCL 10 MG/ML IJ SOLN
20.0000 mg | INTRAVENOUS | Status: DC | PRN
  Administered 2013-06-07: 60 ug/min via INTRAVENOUS

## 2013-06-07 MED ORDER — MENTHOL 3 MG MT LOZG: 1.0000 | LOZENGE | OROMUCOSAL | Status: DC | PRN

## 2013-06-07 MED ORDER — ONDANSETRON HCL 4 MG/2ML IJ SOLN: 4.0000 mg | Freq: Three times a day (TID) | INTRAMUSCULAR | Status: DC | PRN

## 2013-06-07 MED ORDER — WITCH HAZEL-GLYCERIN EX PADS: 1.0000 "application " | MEDICATED_PAD | CUTANEOUS | Status: DC | PRN

## 2013-06-07 SURGICAL SUPPLY — 44 items
BARRIER ADHS 3X4 INTERCEED (GAUZE/BANDAGES/DRESSINGS) IMPLANT
CANISTER WOUND CARE 500ML ATS (WOUND CARE) IMPLANT
CLAMP CORD UMBIL (MISCELLANEOUS) IMPLANT
CLOTH BEACON ORANGE TIMEOUT ST (SAFETY) ×2 IMPLANT
CONTAINER PREFILL 10% NBF 15ML (MISCELLANEOUS) ×4 IMPLANT
DERMABOND ADVANCED (GAUZE/BANDAGES/DRESSINGS) ×1
DERMABOND ADVANCED .7 DNX12 (GAUZE/BANDAGES/DRESSINGS) ×1 IMPLANT
DRAPE LG THREE QUARTER DISP (DRAPES) ×4 IMPLANT
DRSG OPSITE POSTOP 4X10 (GAUZE/BANDAGES/DRESSINGS) ×2 IMPLANT
DRSG VAC ATS LRG SENSATRAC (GAUZE/BANDAGES/DRESSINGS) IMPLANT
DRSG VAC ATS MED SENSATRAC (GAUZE/BANDAGES/DRESSINGS) IMPLANT
DRSG VAC ATS SM SENSATRAC (GAUZE/BANDAGES/DRESSINGS) IMPLANT
DURAPREP 26ML APPLICATOR (WOUND CARE) ×2 IMPLANT
ELECT REM PT RETURN 9FT ADLT (ELECTROSURGICAL) ×2
ELECTRODE REM PT RTRN 9FT ADLT (ELECTROSURGICAL) ×1 IMPLANT
EXTRACTOR VACUUM M CUP 4 TUBE (SUCTIONS) IMPLANT
GLOVE BIO SURGEON STRL SZ8 (GLOVE) ×4 IMPLANT
GOWN PREVENTION PLUS XLARGE (GOWN DISPOSABLE) ×2 IMPLANT
GOWN STRL REIN XL XLG (GOWN DISPOSABLE) ×4 IMPLANT
KIT ABG SYR 3ML LUER SLIP (SYRINGE) IMPLANT
NEEDLE HYPO 25X5/8 SAFETYGLIDE (NEEDLE) ×2 IMPLANT
NS IRRIG 1000ML POUR BTL (IV SOLUTION) ×2 IMPLANT
PACK C SECTION WH (CUSTOM PROCEDURE TRAY) ×2 IMPLANT
PAD OB MATERNITY 4.3X12.25 (PERSONAL CARE ITEMS) ×2 IMPLANT
RTRCTR C-SECT PINK 25CM LRG (MISCELLANEOUS) ×2 IMPLANT
STAPLER VISISTAT 35W (STAPLE) IMPLANT
SUT GUT PLAIN 0 CT-3 TAN 27 (SUTURE) IMPLANT
SUT MNCRL 0 VIOLET CTX 36 (SUTURE) ×3 IMPLANT
SUT MNCRL AB 4-0 PS2 18 (SUTURE) IMPLANT
SUT MON AB 2-0 CT1 27 (SUTURE) ×2 IMPLANT
SUT MON AB 3-0 SH 27 (SUTURE)
SUT MON AB 3-0 SH27 (SUTURE) IMPLANT
SUT MONOCRYL 0 CTX 36 (SUTURE) ×3
SUT PDS AB 0 CTX 60 (SUTURE) IMPLANT
SUT PLAIN 2 0 XLH (SUTURE) IMPLANT
SUT VIC AB 0 CTX 36 (SUTURE)
SUT VIC AB 0 CTX36XBRD ANBCTRL (SUTURE) IMPLANT
SUT VIC AB 2-0 CT1 27 (SUTURE)
SUT VIC AB 2-0 CT1 TAPERPNT 27 (SUTURE) IMPLANT
SUT VIC AB 3-0 SH 27 (SUTURE) ×1
SUT VIC AB 3-0 SH 27X BRD (SUTURE) ×1 IMPLANT
TOWEL OR 17X24 6PK STRL BLUE (TOWEL DISPOSABLE) ×2 IMPLANT
TRAY FOLEY CATH 14FR (SET/KITS/TRAYS/PACK) ×2 IMPLANT
WATER STERILE IRR 1000ML POUR (IV SOLUTION) ×2 IMPLANT

## 2013-06-07 NOTE — Anesthesia Procedure Notes (Signed)

## 2013-06-07 NOTE — Lactation Note (Signed)
This note was copied from the chart of Kim [name]. Lactation Consultation Note  Patient Name: Kim [name] [id number] Date: 06/07/2013 Reason for consult: Initial assessment Baby sleepy at this visit, but has not latched yet. On exam, Mom has aerola edema and short nipple shaft. No colostrum with hand expression. Demonstrated reverse pressure massage and how to use hand pump to help with latch. Attempted to latch baby but she was not interested, would take few suckles on my finger but sleepy. BF basics reviewed with Mom. Encouraged to BF with feeding ques, keep baby STS when awake. Lactation brochure left for review, advised of OP services and support group. Advised Mom to call for assist with feedings till baby is latching. Pre-pump as discussed. Left with baby STS on Mom.  Maternal Data Formula Feeding for Exclusion: No Infant to breast within first hour of birth: No Breastfeeding delayed due to:: Maternal status Has patient been taught Hand Expression?: Yes Does the patient have breastfeeding experience prior to this delivery?: No  Feeding Feeding Type: Breast Fed Length of feed: 0 min  LATCH Score/Interventions Latch: Too sleepy or reluctant, no latch achieved, no sucking elicited. Intervention(s): Skin to skin;Teach feeding cues  Audible Swallowing: None  Type of Nipple: Everted at rest and after stimulation (aerola edema, short nipple shaft)  Comfort (Breast/Nipple): Soft / non-tender     Hold (Positioning): Assistance needed to correctly position infant at breast and maintain latch. Intervention(s): Breastfeeding basics reviewed;Support Pillows;Position options;Skin to skin  LATCH Score: 5  Lactation Tools Discussed/Used Tools: Pump Breast pump type: Manual   Consult Status Consult Status: Follow-up Date: 06/08/13 Follow-up type: In-patient    Alfred Levins 06/07/2013, 11:01 PM

## 2013-06-07 NOTE — H&P (Signed)
Kim [name] is a 27 y.o. female presenting for C/S. Maternal Medical History:  Reason for admission: 27 yo G1.  EDC 06-13-13.  Presents for C/S for breech presentation.  Fetal activity: Perceived fetal activity is normal.   Last perceived fetal movement was within the past hour.    Prenatal complications: no prenatal complications Prenatal Complications - Diabetes: none.    OB History   Grav Para Term Preterm Abortions TAB SAB Ect Mult Living   1              Past Medical History  Diagnosis Date  . Fibroid     1.9cm  . 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 Anxiety   . GERD (gastroesophageal reflux disease)    Past Surgical History  Procedure Laterality Date  . No past surgeries     Family History: family history is not on file. Social History:  reports that she has never smoked. She does not have any smokeless tobacco history on file. She reports that she drinks alcohol. Her drug history is not on file.   Prenatal Transfer Tool  Maternal Diabetes: No Genetic Screening: Normal Maternal Ultrasounds/Referrals: Normal Fetal Ultrasounds or other Referrals:  None Maternal Substance Abuse:  No Significant Maternal Medications:  None Significant Maternal Lab Results:  None Other Comments:  None  Review of Systems  All other systems reviewed and are negative.      Blood pressure 110/77, pulse 100, temperature 98.2 F (36.8 C), temperature source Oral, resp. rate 18, last menstrual period 08/26/2012, SpO2 98.00%. Maternal Exam:  Abdomen: Patient reports no abdominal tenderness. Fetal presentation: breech     Physical Exam  Nursing note and vitals reviewed. Constitutional: She is oriented to person, place, and time. She appears well-developed and well-nourished.  HENT:  Head: Normocephalic and atraumatic.  Eyes: Conjunctivae are normal. Pupils are equal, round, and reactive to light.  Neck: Normal range of motion. Neck supple.  Cardiovascular: Normal rate and  regular rhythm.   Respiratory: Effort normal.  GI: Soft.  Musculoskeletal: Normal range of motion.  Neurological: She is alert and oriented to person, place, and time.  Skin: Skin is warm and dry.  Psychiatric: She has a normal mood and affect. Her behavior is normal. Judgment and thought content normal.    Prenatal labs: ABO, Rh: --/--/O POS, O POS (10/14 1145) Antibody: NEG (10/14 1145) Rubella: Immune (04/09 0000) RPR: NON REACTIVE (10/14 1145)  HBsAg:    HIV: Non-reactive (04/09 0000)  GBS:     Assessment/Plan: 39 weeks.  Breech presentation.  C/S planned.   HARPER,CHARLES A 06/07/2013, 6:46 AM

## 2013-06-07 NOTE — Op Note (Signed)
Cesarean Section Procedure Note   Kim [name]   06/07/2013  Indications: Breech Presentation   Pre-operative Diagnosis: breech.   Post-operative Diagnosis: Same   Surgeon: Coral Ceo A  Assistants: Francoise Ceo  Anesthesia: spinal  Procedure Details:  The patient was seen in the Holding Room. The risks, benefits, complications, treatment options, and expected outcomes were discussed with the patient. The patient concurred with the proposed plan, giving informed consent. The patient was identified as [name] and the procedure verified as C-Section Delivery. A Time Out was held and the above information confirmed.  After induction of anesthesia, the patient was draped and prepped in the usual sterile manner. A transverse incision was made and carried down through the subcutaneous tissue to the fascia. The fascial incision was made and extended transversely. The fascia was separated from the underlying rectus tissue superiorly and inferiorly. The peritoneum was identified and entered. The peritoneal incision was extended longitudinally. The utero-vesical peritoneal reflection was incised transversely and the bladder flap was bluntly freed from the lower uterine segment. A low transverse uterine incision was made. Delivered from cephalic presentation was a 2665 gram living newborn female infant(s). APGAR (1 MIN): 7   APGAR (5 MINS): 9   APGAR (10 MINS):    A cord ph was not sent. The umbilical cord was clamped and cut cord. A sample was obtained for evaluation. The placenta was removed Intact and appeared normal.  The uterine incision was closed with running locked sutures of 1-0 Monocryl. A second imbricating layer of the same suture was placed.  Hemostasis was observed. The paracolic gutters were irrigated. The parieto peritoneum was closed in a running fashion with 2-0 Vicryl.  The fascia was then reapproximated with running sutures of 0 Vicryl.  The skin was closed with staples.   Instrument, sponge, and needle counts were correct prior the abdominal closure and were correct at the conclusion of the case.    Findings:   Estimated Blood Loss: * No blood loss amount entered *   Total IV Fluids:   Urine Output: 200CC OF clear urine  Specimens: @ORSPECIMEN @   Complications: no complications  Disposition: PACU - hemodynamically stable.  Maternal Condition: stable   Baby condition / location:  Baby placed on mother skin to skin immediately post op, then to Newborn Nursery per protocol in good condition.    Signed: Surgeon(s): Brock Bad, MD Kathreen Cosier, MD

## 2013-06-07 NOTE — Anesthesia Preprocedure Evaluation (Addendum)
Anesthesia Evaluation  Patient identified by MRN, date of birth, ID band Patient awake    Reviewed: Allergy & Precautions, H&P , NPO status , Patient's Chart, lab work & pertinent test results  Airway Mallampati: IV TM Distance: >3 FB Neck ROM: full  Mouth opening: Limited Mouth Opening  Dental no notable dental hx.    Pulmonary neg pulmonary ROS,    Pulmonary exam normal       Cardiovascular negative cardio ROS      Neuro/Psych negative neurological ROS     GI/Hepatic negative GI ROS, Neg liver ROS,   Endo/Other  negative endocrine ROS  Renal/GU negative Renal ROS     Musculoskeletal negative musculoskeletal ROS (+)   Abdominal Normal abdominal exam  (+)   Peds  Hematology negative hematology ROS (+)   Anesthesia Other Findings   Reproductive/Obstetrics (+) Pregnancy                          Anesthesia Physical Anesthesia Plan  ASA: II  Anesthesia Plan: Spinal   Post-op Pain Management:    Induction:   Airway Management Planned:   Additional Equipment:   Intra-op Plan:   Post-operative Plan:   Informed Consent: I have reviewed the patients History and Physical, chart, labs and discussed the procedure including the risks, benefits and alternatives for the proposed anesthesia with the patient or authorized representative who has indicated his/her understanding and acceptance.     Plan Discussed with: CRNA and Surgeon  Anesthesia Plan Comments:         Anesthesia Quick Evaluation

## 2013-06-07 NOTE — Anesthesia Postprocedure Evaluation (Signed)
  Anesthesia Post-op Note  Patient: Kim [name]  Procedure(s) Performed: Procedure(s): PRIMARY CESAREAN SECTION (N/A)  Patient Location: PACU and Mother/Baby  Anesthesia Type:Spinal  Level of Consciousness: awake, alert , oriented and patient cooperative  Airway and Oxygen Therapy: Patient Spontanous Breathing  Post-op Pain: none  Post-op Assessment: Post-op Vital signs reviewed, Patient's Cardiovascular Status Stable and Respiratory Function Stable  Post-op Vital Signs: Reviewed and stable  Complications: No apparent anesthesia complications

## 2013-06-07 NOTE — Transfer of Care (Signed)
Immediate Anesthesia Transfer of Care Note  Patient: Kim [name]  Procedure(s) Performed: Procedure(s): PRIMARY CESAREAN SECTION (N/A)  Patient Location: PACU  Anesthesia Type:Spinal  Level of Consciousness: awake, alert  and oriented  Airway & Oxygen Therapy: Patient Spontanous Breathing  Post-op Assessment: Report given to PACU RN and Post -op Vital signs reviewed and stable  Post vital signs: stable  Complications: No apparent anesthesia complications

## 2013-06-08 ENCOUNTER — Encounter (HOSPITAL_COMMUNITY): Payer: Self-pay | Admitting: Obstetrics

## 2013-06-08 LAB — CBC
HCT: 30.2 % — ABNORMAL LOW (ref 36.0–46.0)
MCH: 32.3 pg (ref 26.0–34.0)
MCHC: 35.1 g/dL (ref 30.0–36.0)
MCV: 92.1 fL (ref 78.0–100.0)
Platelets: 171 10*3/uL (ref 150–400)
RDW: 13.9 % (ref 11.5–15.5)

## 2013-06-08 NOTE — Progress Notes (Signed)
Subjective: Postpartum Day 1: Cesarean Delivery Patient reports tolerating PO and no problems voiding.    Objective: Vital signs in last 24 hours: Temp:  [98.1 F (36.7 C)-99.4 F (37.4 C)] 98.3 F (36.8 C) (10/17 0542) Pulse Rate:  [60-85] 67 (10/17 0542) Resp:  [17-21] 18 (10/17 0542) BP: (78-132)/(49-80) 101/59 mmHg (10/17 0542) SpO2:  [95 %-100 %] 96 % (10/17 0542)  Physical Exam:  General: alert and no distress Lochia: appropriate Uterine Fundus: firm Incision: healing well DVT Evaluation: No evidence of DVT seen on physical exam.   Recent Labs  06/05/13 1145 06/08/13 0635  HGB 12.5 10.6*  HCT 36.3 30.2*    Assessment/Plan: Status post Cesarean section. Doing well postoperatively.  Continue current care.  Zorah Backes A 06/08/2013, 8:48 AM

## 2013-06-08 NOTE — Progress Notes (Signed)
UR chart review completed.  

## 2013-06-09 ENCOUNTER — Ambulatory Visit: Payer: Self-pay

## 2013-06-09 MED ORDER — OXYCODONE-ACETAMINOPHEN 5-325 MG PO TABS: 1.0000 | ORAL_TABLET | ORAL | Status: DC | PRN

## 2013-06-09 MED ORDER — NORETHINDRONE 0.35 MG PO TABS: 1.0000 | ORAL_TABLET | Freq: Every day | ORAL | Status: DC

## 2013-06-09 NOTE — Plan of Care (Signed)
Problem: Discharge Progression Outcomes Goal: Remove staples per MD order Outcome: Not Applicable Date Met:  06/09/13 To return to MD office on 06/14/13 for staple removal

## 2013-06-09 NOTE — Lactation Note (Signed)
This note was copied from the chart of Kim [name]. Lactation Consultation Note  Patient Name: Kim [name] [id number] Date: 06/09/2013 Reason for consult: Follow-up assessment;Infant < 6lbs;Difficult latch Mom reports baby is not sustaining a latch. They have been supplementing with formula using a curved tipped syringe, 7.5 ml each feeding. Parents are packed and ready to go home. Mom declined to try and latch baby at this visit. She reports she will pump and bottle feed for now. Advised parents they need to increase supplement to 20-25 ml each feeding, increasing per guidelines discussed and by the end of 1st week baby should take 45-60 ml each feeding. WIC loaner pump rental done for Mom to have pump to use till she gets to [facility] next week. Encouraged to schedule OP follow up if she would like to attempt to get baby back to the breast. Engorgement care reviewed if needed. Observed FOB give 1st bottle, baby took 7 ml. Parents are ready to go and report they will feed baby when they get home.    Maternal Data    Feeding Feeding Type: Bottle Fed - Formula  LATCH Score/Interventions                      Lactation Tools Discussed/Used Tools: Pump Breast pump type: Double-Electric Breast Pump WIC Program: Yes Pump Review: Milk Storage;Setup, frequency, and cleaning   Consult Status Consult Status: Complete Date: 06/09/13 Follow-up type: In-patient    Alfred Levins 06/09/2013, 3:40 PM

## 2013-06-09 NOTE — Discharge Summary (Signed)
  Obstetric Discharge Summary Reason for Admission: cesarean section Prenatal Procedures: none Intrapartum Procedures: cesarean: low cervical, transverse Postpartum Procedures: none Complications-Operative and Postpartum: none  Hemoglobin  Date Value Range Status  06/08/2013 10.6* 12.0 - 15.0 g/dL Final  0/04/8118 14.7   Final     HCT  Date Value Range Status  06/08/2013 30.2* 36.0 - 46.0 % Final  11/29/2012 41   Final    Physical Exam:  General: alert Lochia: appropriate Uterine: firm Incision: clean, dry and intact DVT Evaluation: No evidence of DVT seen on physical exam.  Discharge Diagnoses: Active Problems:   Cesarean delivery delivered   Discharge Information: Date: 06/09/2013 Activity: pelvic rest Diet: routine Medications:  Prior to Admission medications   Medication Sig Start Date End Date Taking? Authorizing Provider  Cholecalciferol (VITAMIN D) 2000 UNITS CAPS Take 2,000 Units by mouth daily.   Yes Historical Provider, MD  Omega-3 Fatty Acids (PRENATAL OMEGA BABY PO) Take 3 capsules by mouth daily.    Yes Historical Provider, MD  Prenatal Vit-Fe Fumarate-FA (PRENATAL MULTIVITAMIN) TABS tablet Take 1 tablet by mouth daily at 12 noon.   Yes Historical Provider, MD  norethindrone (ORTHO MICRONOR) 0.35 MG tablet Take 1 tablet (0.35 mg total) by mouth daily. 06/09/13   Antionette Char, MD  oxyCODONE-acetaminophen (PERCOCET/ROXICET) 5-325 MG per tablet Take 1-2 tablets by mouth every 4 (four) hours as needed. 06/09/13   Antionette Char, MD    Condition: stable Instructions: refer to routine discharge instructions Discharge to: home Follow-up Information   Follow up with HARPER,CHARLES A, MD. Schedule an appointment as soon as possible for a visit on 06/14/2013.   Specialty:  Obstetrics and Gynecology   Contact information:   [address] [place] [address] [phone]       Newborn Data:  Live born female  Birth Weight: 5 lb 14  oz (2665 g) APGAR: 7, 9   Home with mother.  JACKSON-MOORE,Kaide Gage A 06/09/2013, 9:55 AM

## 2013-06-13 ENCOUNTER — Ambulatory Visit: Payer: Medicaid Other | Admitting: Obstetrics

## 2013-06-14 ENCOUNTER — Ambulatory Visit (INDEPENDENT_AMBULATORY_CARE_PROVIDER_SITE_OTHER): Payer: Medicaid Other | Admitting: Obstetrics

## 2013-06-14 ENCOUNTER — Encounter: Payer: Self-pay | Admitting: Obstetrics

## 2013-06-14 DIAGNOSIS — N390 Urinary tract infection, site not specified: Secondary | ICD-10-CM

## 2013-06-14 DIAGNOSIS — R52 Pain, unspecified: Secondary | ICD-10-CM

## 2013-06-14 LAB — POCT URINALYSIS DIPSTICK
Blood, UA: NEGATIVE
Glucose, UA: NEGATIVE
Spec Grav, UA: 1.015
Urobilinogen, UA: NEGATIVE
pH, UA: 6

## 2013-06-14 MED ORDER — TRAMADOL HCL 50 MG PO TABS: 50.0000 mg | ORAL_TABLET | Freq: Four times a day (QID) | ORAL | Status: DC | PRN

## 2013-06-14 MED ORDER — IBUPROFEN 800 MG PO TABS: 800.0000 mg | ORAL_TABLET | Freq: Three times a day (TID) | ORAL | Status: DC | PRN

## 2013-06-14 MED ORDER — DOCUSATE SODIUM 100 MG PO CAPS: 24.0000 mg | ORAL_CAPSULE | Freq: Two times a day (BID) | ORAL | Status: DC | PRN

## 2013-06-14 NOTE — Progress Notes (Signed)
Subjective:     Kim [name] is a 27 y.o. female who presents for a postpartum visit. She is 1 week postpartum following a low cervical transverse Cesarean section. I have fully reviewed the prenatal and intrapartum course. The delivery was at 39.1 gestational weeks. Outcome: primary cesarean section, low transverse incision. Anesthesia: spinal. Postpartum course has been normal. Baby's course - baby is in PICU at [facility]- trouble breathing during feeding. Baby is feeding by breast. Bleeding thin lochia. Bowel function is some constipation. Bladder function is normal. Patient is not sexually active. Contraception method is oral progesterone-only contraceptive. Postpartum depression screening: negative.  The following portions of the patient's history were reviewed and updated as appropriate: allergies, current medications, past family history, past medical history, past social history, past surgical history and problem list.  Review of Systems Pertinent items are noted in HPI.   Objective:    BP 116/83  Pulse 78  Temp(Src) 98.8 F (37.1 C)  Wt 166 lb (75.297 kg)  BMI 27.62 kg/m2  LMP 08/26/2012  Breastfeeding? Yes  General:  alert   Breasts:  inspection negative, no nipple discharge or bleeding, no masses or nodularity palpable Abdomen:  Soft, NT.  Staples removed and steri strips applied.   Assessment:     Normal postpartum exam. Pap smear not done at today's visit.   Plan:    1. Contraception: abstinence 2. Contraceptive options discussed. 3. Follow up in: 2 weeks or as needed.

## 2013-06-28 ENCOUNTER — Other Ambulatory Visit: Payer: Self-pay

## 2013-06-28 ENCOUNTER — Ambulatory Visit: Payer: Medicaid Other | Admitting: Obstetrics

## 2013-07-05 ENCOUNTER — Ambulatory Visit (INDEPENDENT_AMBULATORY_CARE_PROVIDER_SITE_OTHER): Payer: Medicaid Other | Admitting: Obstetrics

## 2013-07-05 ENCOUNTER — Encounter: Payer: Self-pay | Admitting: Obstetrics

## 2013-07-05 NOTE — Progress Notes (Signed)
Subjective:     Kim [name] is a 27 y.o. female who presents for a postpartum visit. She is 2 weeks postpartum following a low cervical transverse Cesarean section. I have fully reviewed the prenatal and intrapartum course. The delivery was at 39.1 gestational weeks. Outcome: primary cesarean section, low transverse incision. Anesthesia: spinal. Postpartum course has been normal. 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 Baby is feeding by breast. Bleeding staining only. Bowel function is normal. Bladder function is normal. Patient is not sexually active. Contraception method is oral progesterone-only contraceptive. Postpartum depression screening: negative.  The following portions of the patient's history were reviewed and updated as appropriate: allergies, current medications, past family history, past medical history, past social history, past surgical history and problem list.  Review of Systems Pertinent items are noted in HPI.   Objective:    BP 112/80  Pulse 67  Temp(Src) 97.8 F (36.6 C)  Ht 5\' 5"  (1.651 m)  Wt 155 lb (70.308 kg)  BMI 25.79 kg/m2  LMP 08/26/2012  Breastfeeding? Yes  General:  alert and no distress   Breasts:  inspection negative, no nipple discharge or bleeding, no masses or nodularity palpable Abdomen:  Incision C, D, I and NT.                                   Assessment:     Normal postpartum exam. Pap smear not done at today's visit.   Plan:    1. Contraception: abstinence 2. Contraceptive options discussed. 3. Follow up in: 4 weeks or as needed.

## 2013-08-02 ENCOUNTER — Ambulatory Visit: Payer: Medicaid Other | Admitting: Obstetrics

## 2013-08-06 ENCOUNTER — Ambulatory Visit: Payer: Self-pay | Admitting: Obstetrics

## 2013-08-27 ENCOUNTER — Ambulatory Visit: Payer: Medicaid Other | Admitting: Obstetrics

## 2013-08-31 ENCOUNTER — Ambulatory Visit (INDEPENDENT_AMBULATORY_CARE_PROVIDER_SITE_OTHER): Payer: Medicaid Other | Admitting: Obstetrics

## 2013-08-31 ENCOUNTER — Encounter: Payer: Self-pay | Admitting: Obstetrics

## 2013-08-31 DIAGNOSIS — Z3009 Encounter for other general counseling and advice on contraception: Secondary | ICD-10-CM

## 2013-08-31 MED ORDER — NORGESTIM-ETH ESTRAD TRIPHASIC 0.18/0.215/0.25 MG-25 MCG PO TABS: 1.0000 | ORAL_TABLET | Freq: Every day | ORAL | Status: DC

## 2013-08-31 NOTE — Progress Notes (Signed)
Subjective:     Kim [name] is a 28 y.o. female who presents for a postpartum visit. She is 12 weeks postpartum following a low cervical transverse Cesarean section. I have fully reviewed the prenatal and intrapartum course. The delivery was at 39.1 gestational weeks. Outcome: primary cesarean section, low transverse incision. Anesthesia: spinal. Postpartum course has been going well. Baby's course has been going well. Baby is feeding by bottle - [employer]- gentle. Bleeding thick, heavy lochia. Bowel function is normal. Bladder function is normal. Patient is sexually active. Contraception method is OCP (estrogen/progesterone). Postpartum depression screening: negative. Patient states that with current birth control periods are irregular and would like to discuss other options. Patient has previously used triclycline and was happy with that.   The following portions of the patient's history were reviewed and updated as appropriate: allergies, current medications, past family history, past medical history, past social history, past surgical history and problem list.  Review of Systems Pertinent items are noted in HPI.   Objective:    BP 124/77  Pulse 90  Temp(Src) 98.2 F (36.8 C)  Ht 5\' 5"  (1.651 m)  Wt 153 lb (69.4 kg)  BMI 25.46 kg/m2  LMP 08/26/2013  Breastfeeding? No  General:  alert and no distress   Breasts:  inspection negative, no nipple discharge or bleeding, no masses or nodularity palpable Abdomen:  Soft, NT.  Incision C, D, I.   Assessment:     Normal postpartum exam. Pap smear not done at today's visit.    Contraceptive counseling done.  Plan:    1. Contraception: OCP (estrogen/progesterone) 2. Continue PNV's 3. Follow up in: several months or as needed.

## 2013-12-31 ENCOUNTER — Ambulatory Visit: Payer: Self-pay | Admitting: Obstetrics

## 2014-06-24 ENCOUNTER — Encounter: Payer: Self-pay | Admitting: Obstetrics

## 2014-07-08 IMAGING — US US FETAL BPP W/O NONSTRESS
1 series · 9 of 9 positions shown · non-contrast
Comparison: none

[Series 1: us fetal bpp w/o nonstress · 0.28mm/px · 9 acquisitions, 9 frames shown]
[im 1/9]
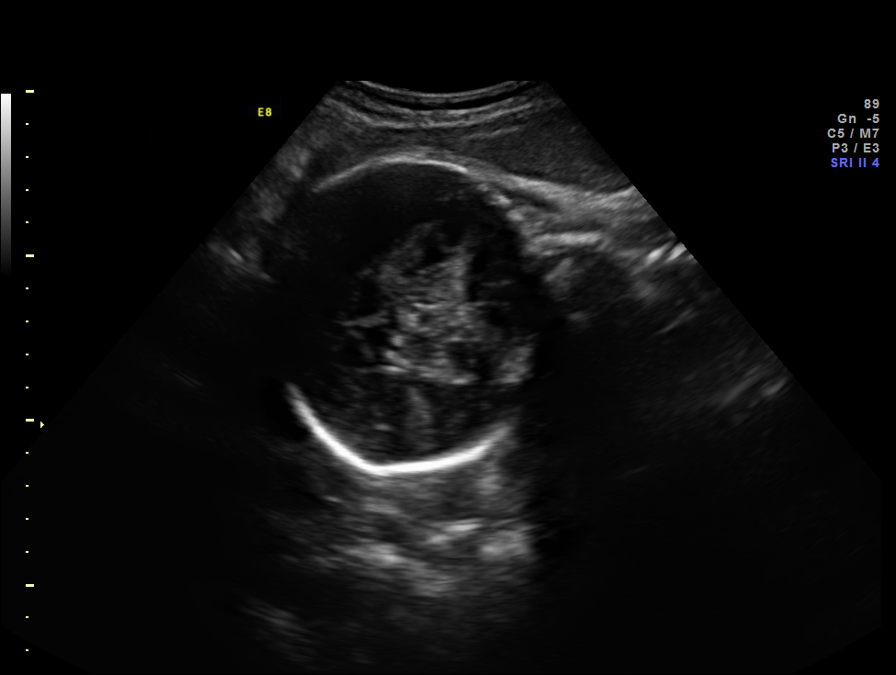
[im 2/9]
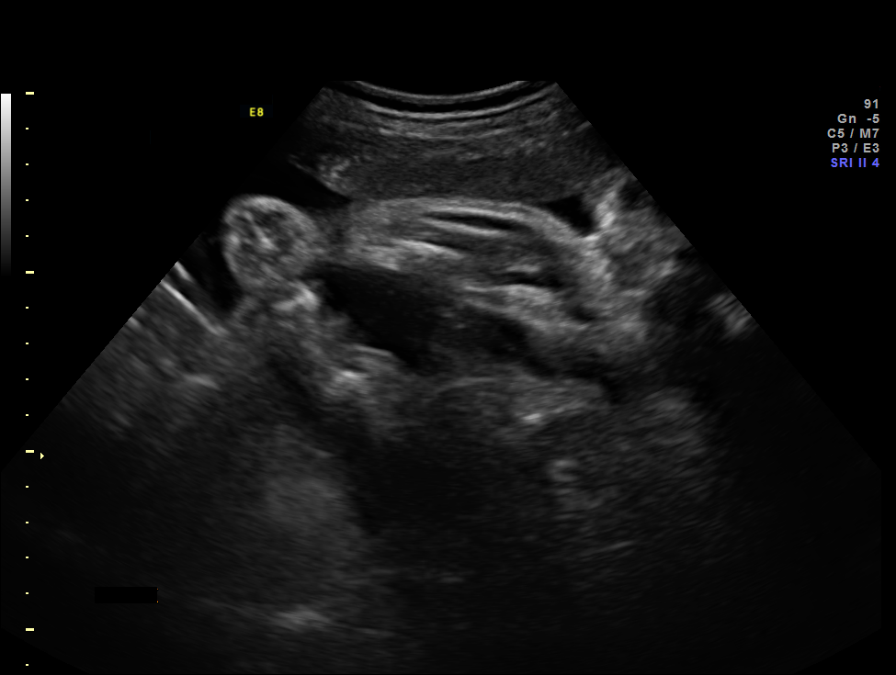
[im 3/9]
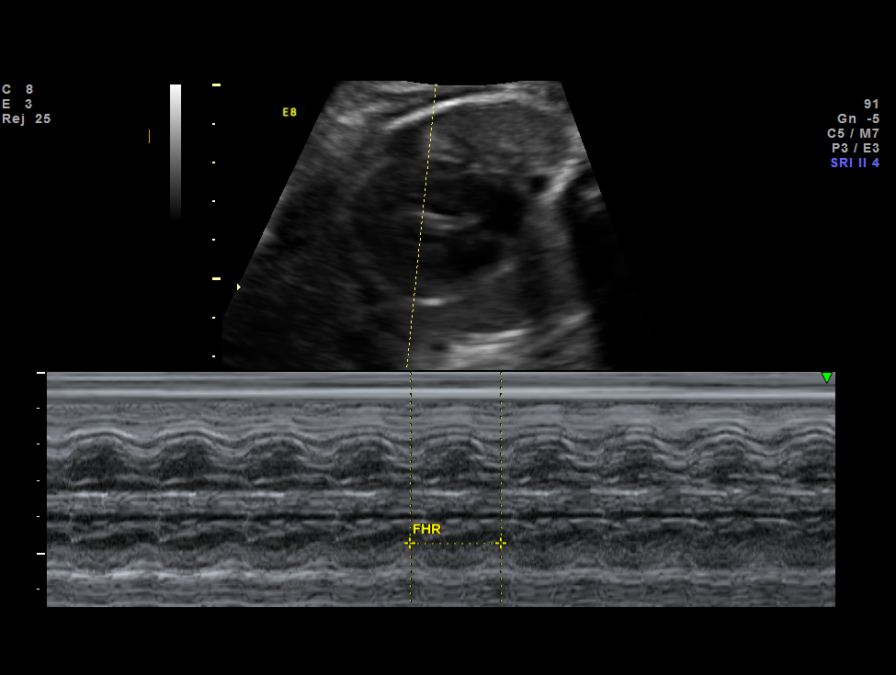
[im 4/9]
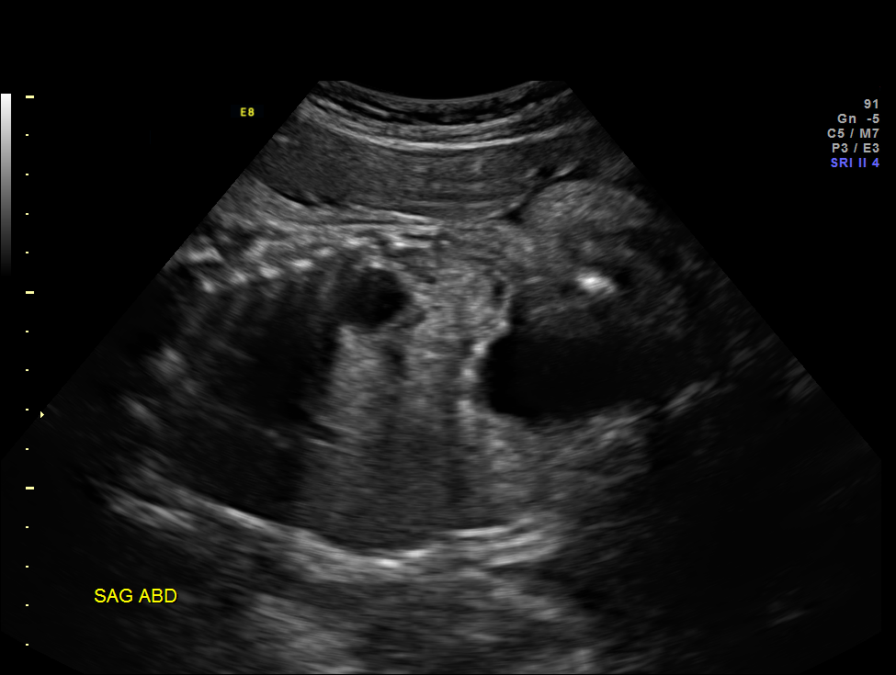
[im 5/9]
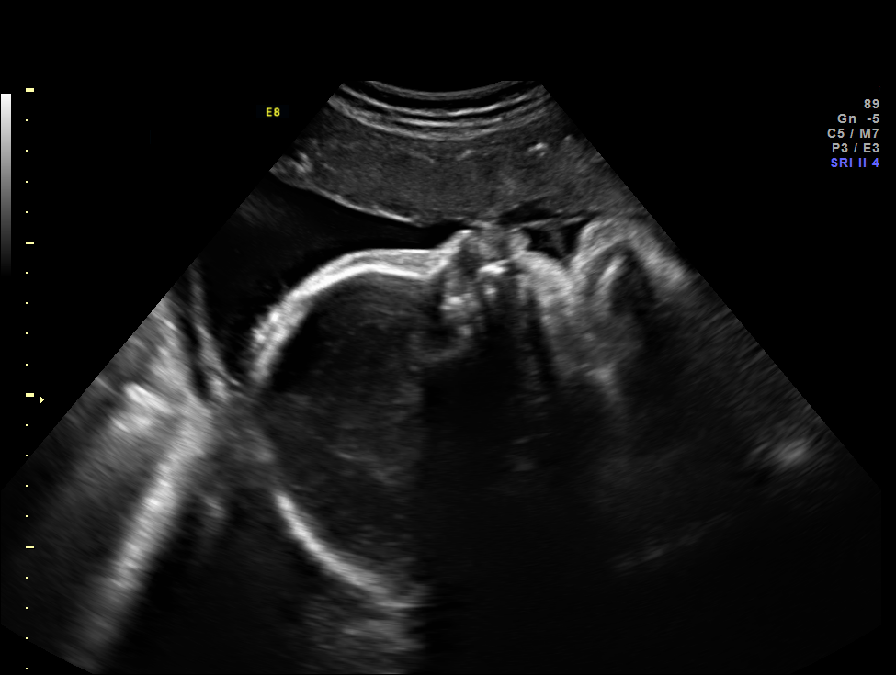
[im 6/9]
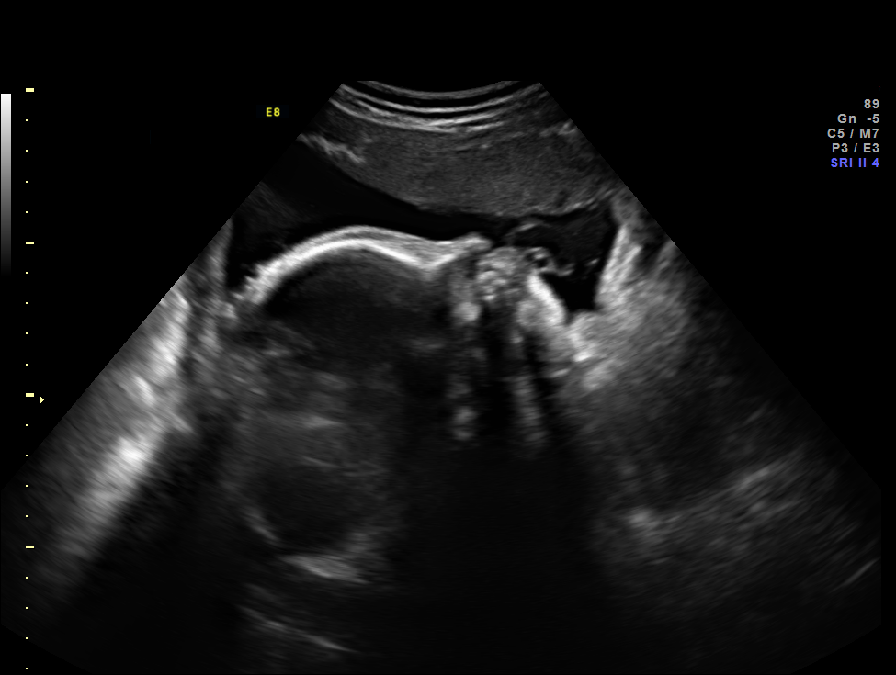
[im 7/9]
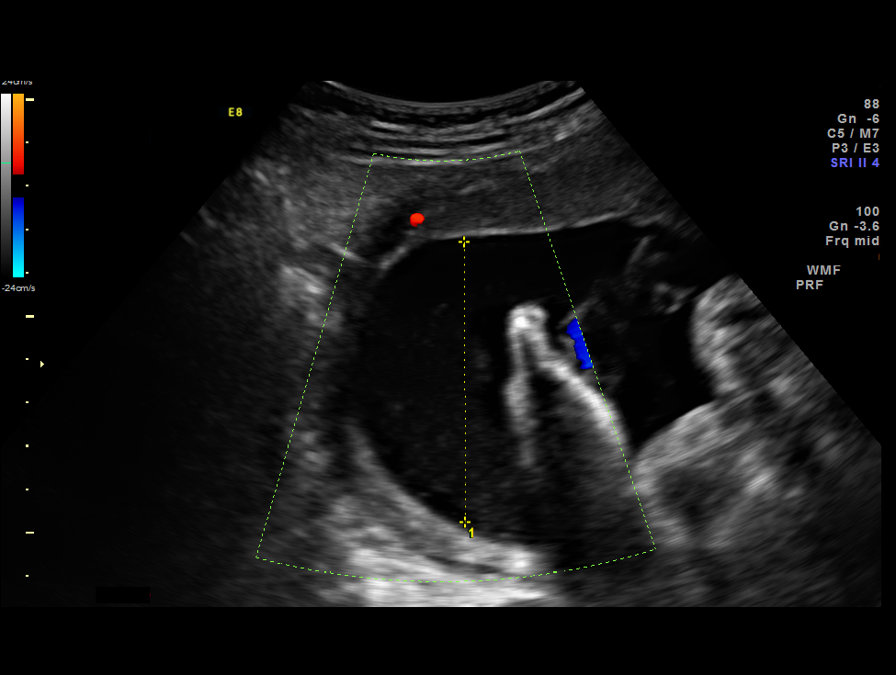
[im 8/9]
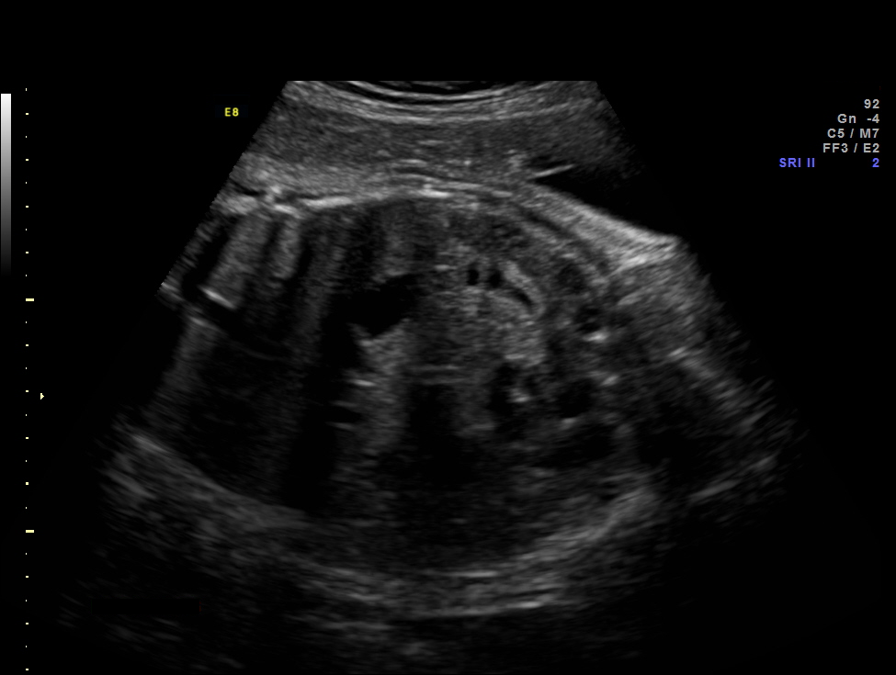
[im 9/9]
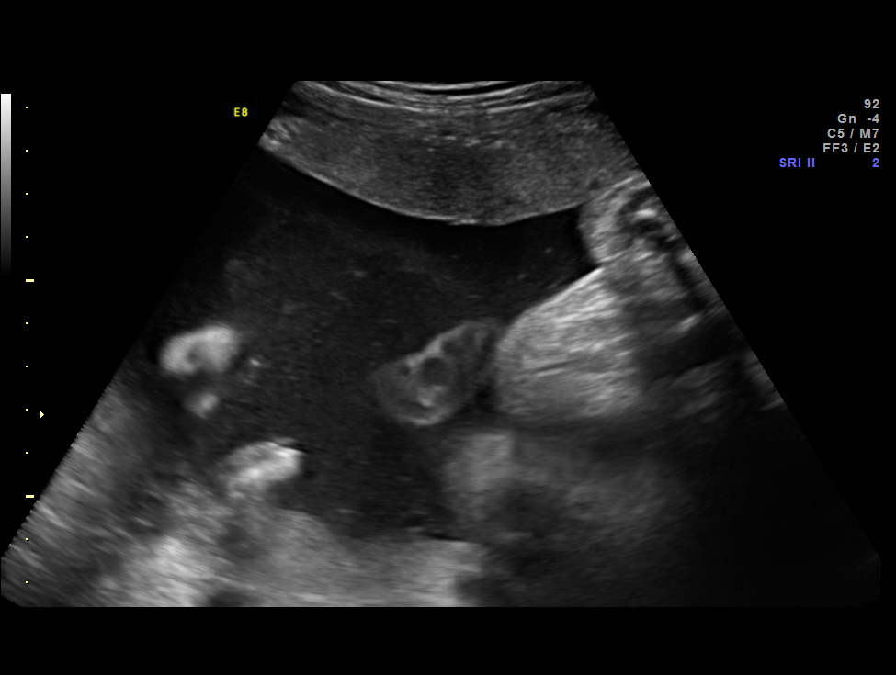

[9 of 9 positions shown; findings below may reference images not displayed]

OBSTETRICS REPORT
                      (Signed Final 05/18/2013 [DATE])

Service(s) Provided

Indications

 Size less than dates; lagging AC: 5th %tile
 Non-reactive NST
Fetal Evaluation

 Num Of Fetuses:    1
 Fetal Heart Rate:  136                          bpm
 Cardiac Activity:  Observed
 Presentation:      Frank breech
 Placenta:          Anterior, above cervical os
 P. Cord            Previously Visualized
 Insertion:

 Amniotic Fluid
 AFI FV:      Subjectively within normal limits
                                             Larg Pckt:     6.4  cm
Biophysical Evaluation

 Amniotic F.V:   Pocket => 2 cm two         F. Tone:        Observed
                 planes
 F. Movement:    Observed                   Score:          [DATE]
 F. Breathing:   Observed
Gestational Age

 Best:          36w 2d     Det. By:  Early Ultrasound         EDD:   06/13/13
Impression

 IUP at 36+2 weeks
 Normal amniotic fluid volume
 BPP [DATE]
Recommendations

 Continue twice weekly NSTs with weekly AFIs
 questions or concerns.

## 2014-07-12 IMAGING — US US UA CORD DOPPLER
1 series · 9 of 9 positions shown · non-contrast
Comparison: none

[Series 1: us ua cord doppler · 9 of 9 slices shown]
[im 1/9]
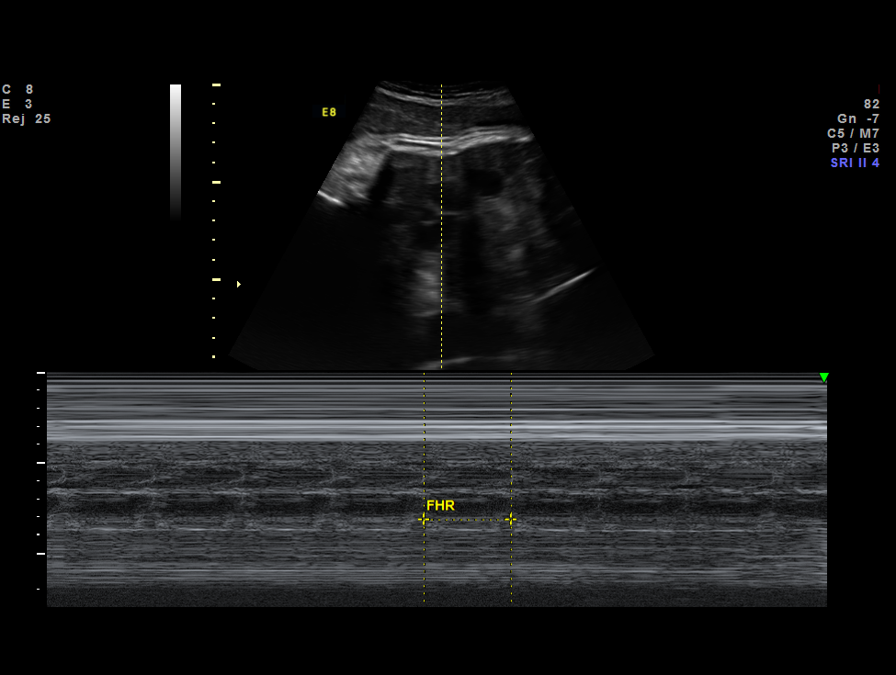
[im 2/9]
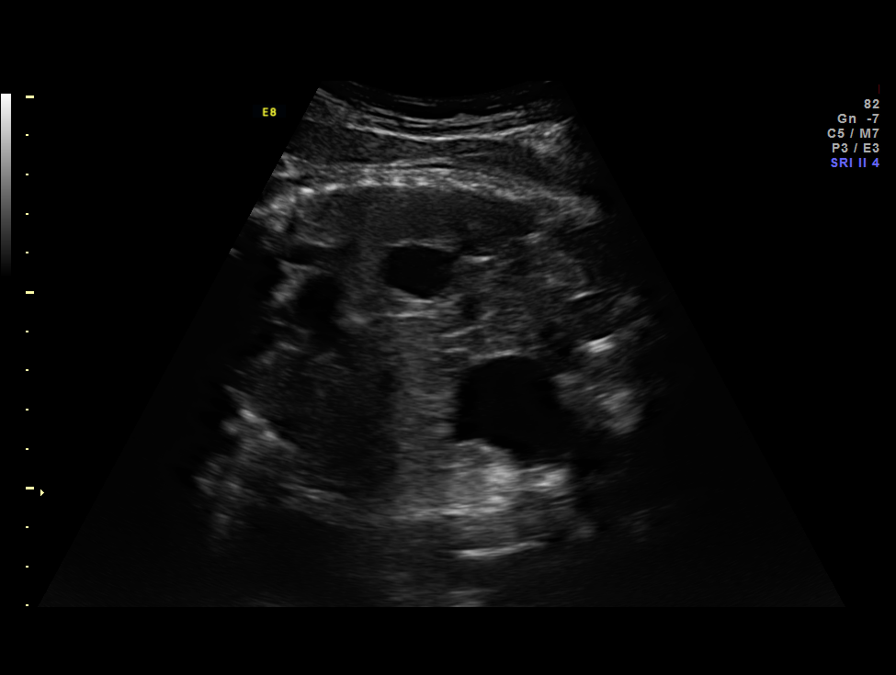
[im 3/9]
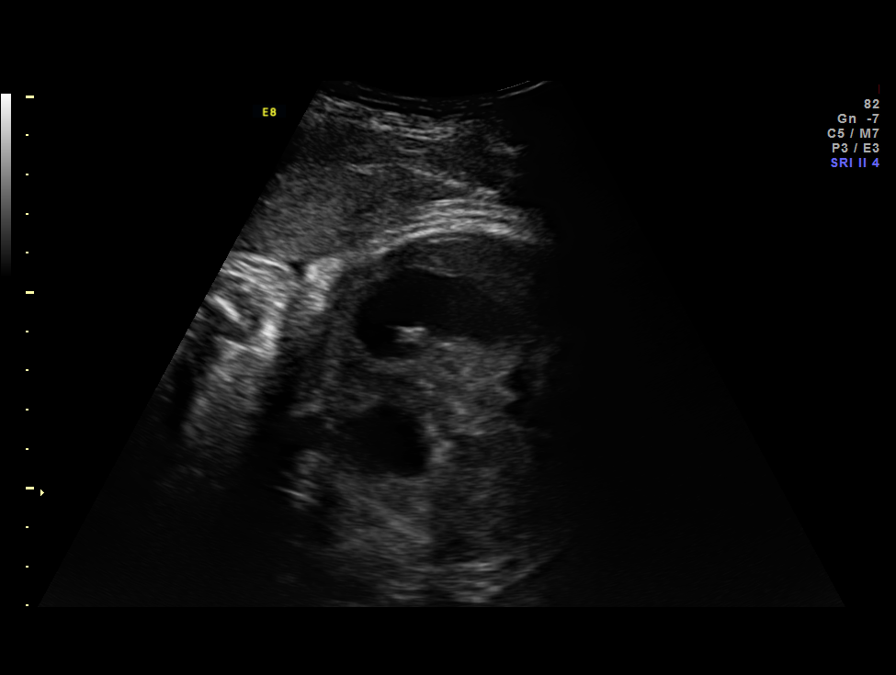
[im 4/9]
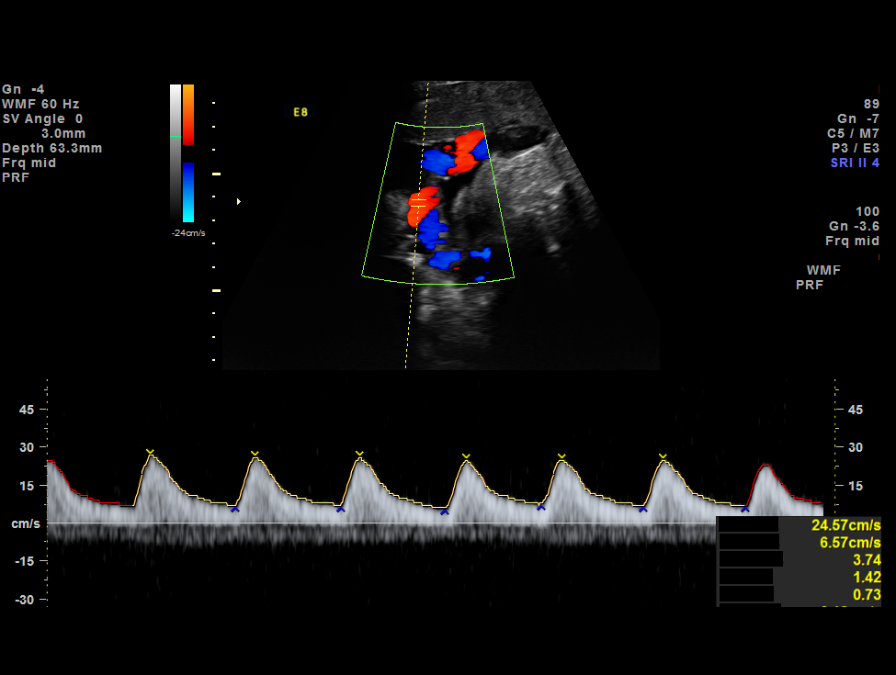
[im 5/9]
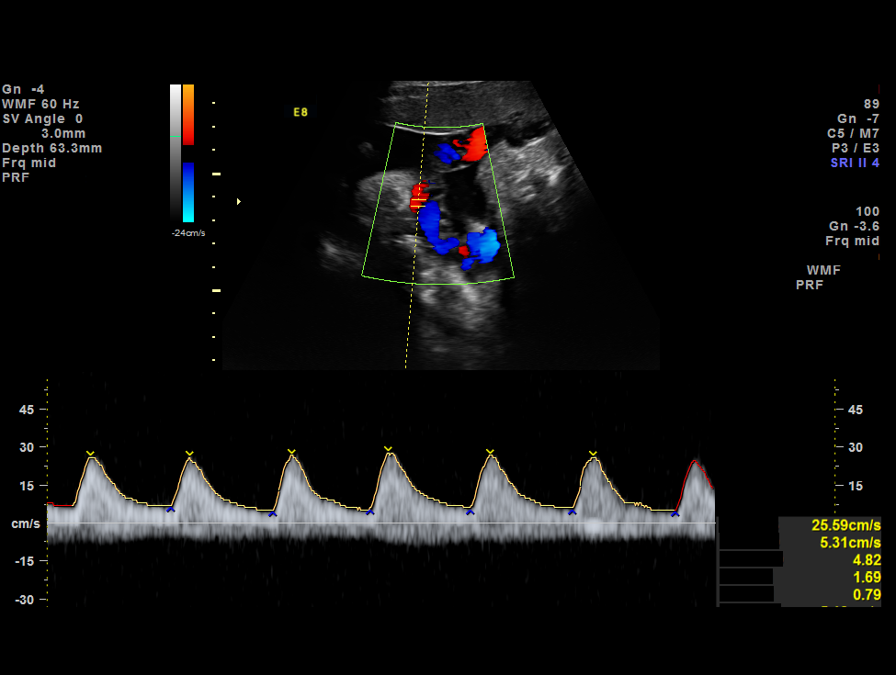
[im 6/9]
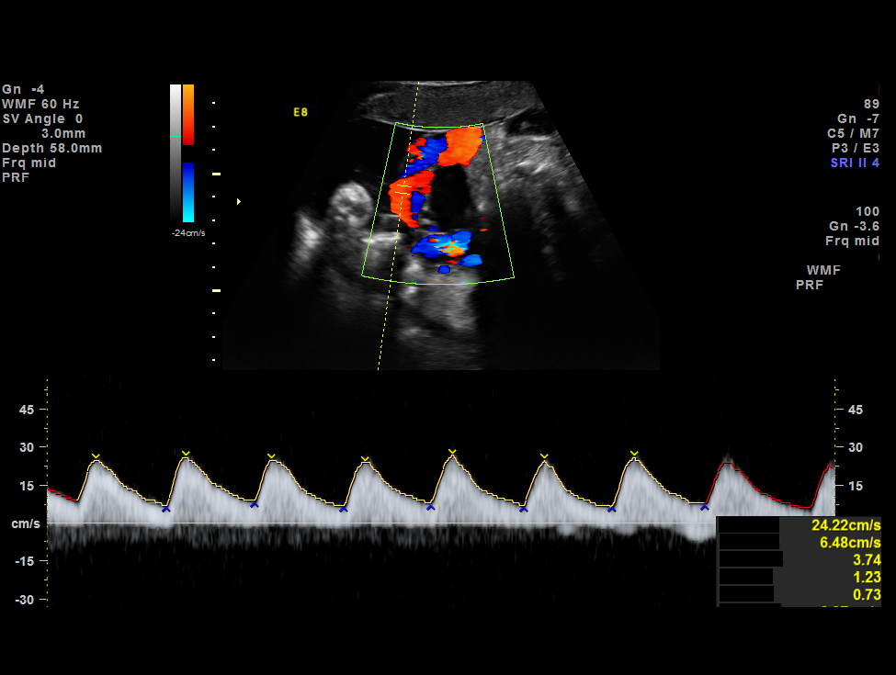
[im 7/9]
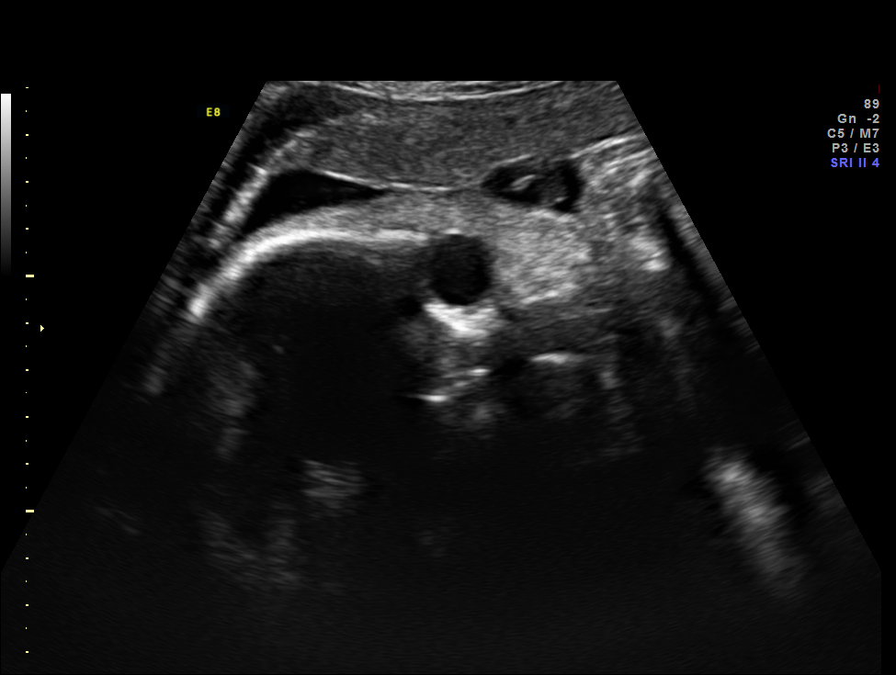
[im 8/9]
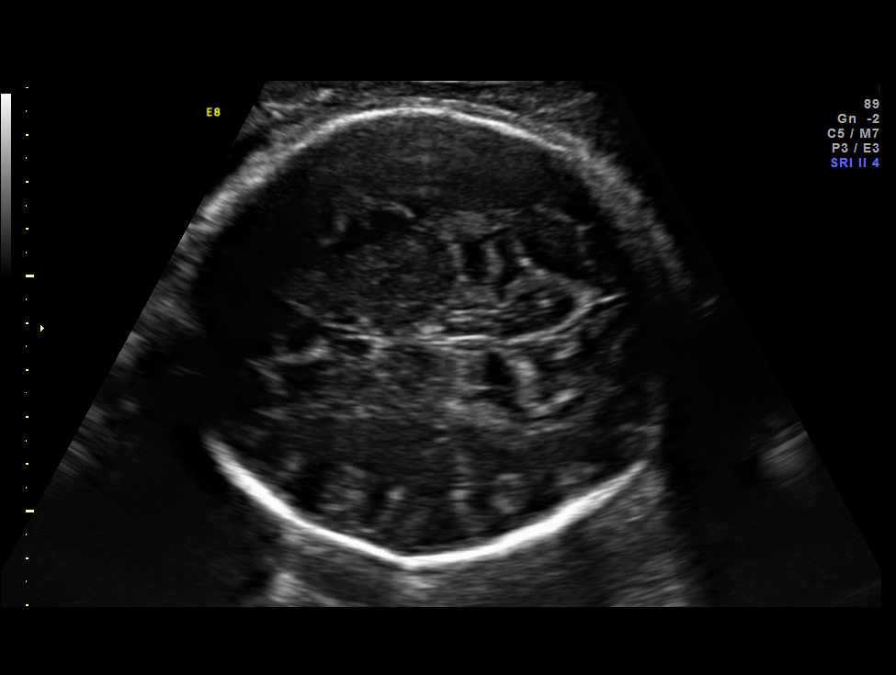
[im 9/9]
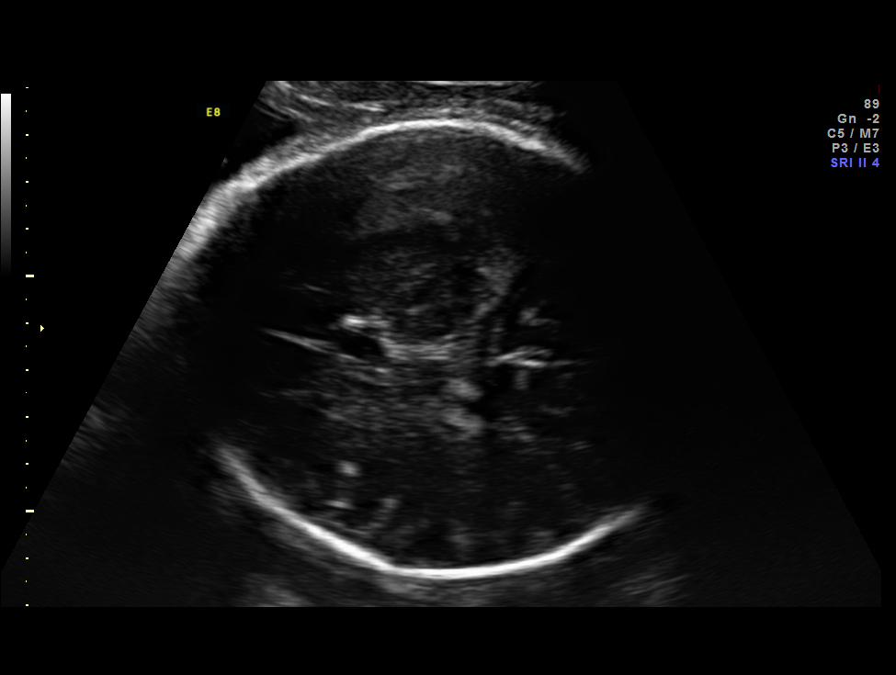

[9 of 9 positions shown; findings below may reference images not displayed]

OBSTETRICS REPORT
                      (Signed Final 05/22/2013 [DATE])

Service(s) Provided

 [HOSPITAL]                                         76815.0
 US UA CORD DOPPLER                                    76820.0
Indications

 Size less than dates; lagging AC: 5th %tile
Fetal Evaluation

 Num Of Fetuses:    1
 Fetal Heart Rate:  142                          bpm
 Cardiac Activity:  Observed
 Presentation:      Breech
 Placenta:          Anterior, above cervical os
 P. Cord            Previously Visualized
 Insertion:

 Amniotic Fluid
 AFI FV:      Subjectively within normal limits
 AFI Sum:     13.92   cm       52  %Tile     Larg Pckt:    5.52  cm
 RUQ:   3.88    cm   RLQ:    5.52   cm    LUQ:   2.46    cm   LLQ:    2.06   cm
Gestational Age

 Best:          36w 6d     Det. By:  Early Ultrasound         EDD:   06/13/13
Doppler - Fetal Vessels

 Umbilical Artery
 S/D:   4.1        > 97.5  %tile       RI:
 PI:    1.45                           PSV:       25.59   cm/s

Cervix Uterus Adnexa

 Cervix:       Not visualized (advanced GA >47wks)
Impression

 IUP at 36+6 weeks
 Normal amniotic fluid volume
 UA dopplers were elevated  for this GA
 NST reactive
Recommendations

 Continue twice weekly NSTs with weekly AFIs and UA
 dopplers
 Growth US next [REDACTED]

 questions or concerns.

## 2014-07-16 ENCOUNTER — Telehealth: Payer: Self-pay | Admitting: *Deleted

## 2014-07-16 ENCOUNTER — Other Ambulatory Visit: Payer: Self-pay | Admitting: *Deleted

## 2014-07-16 DIAGNOSIS — Z3041 Encounter for surveillance of contraceptive pills: Secondary | ICD-10-CM

## 2014-07-16 MED ORDER — NORGESTIM-ETH ESTRAD TRIPHASIC 0.18/0.215/0.25 MG-25 MCG PO TABS
1.0000 | ORAL_TABLET | Freq: Every day | ORAL | Status: DC
Start: 2014-07-16 — End: 2014-07-26

## 2014-07-16 NOTE — Telephone Encounter (Signed)
Patient is going out of town for Thanksgiving and she is requesting a RF on her birth control. Patient advised Rx sent to pharmacy and needs to call to schedule an annual in January.

## 2014-07-26 ENCOUNTER — Other Ambulatory Visit: Payer: Self-pay | Admitting: Obstetrics

## 2014-07-26 IMAGING — US US OB LIMITED
1 series · 13 of 17 positions shown · non-contrast
Comparison: none

[Series 1: us ob limited · 0.23mm/px · 13 of 17 slices shown]
[im 1/17]
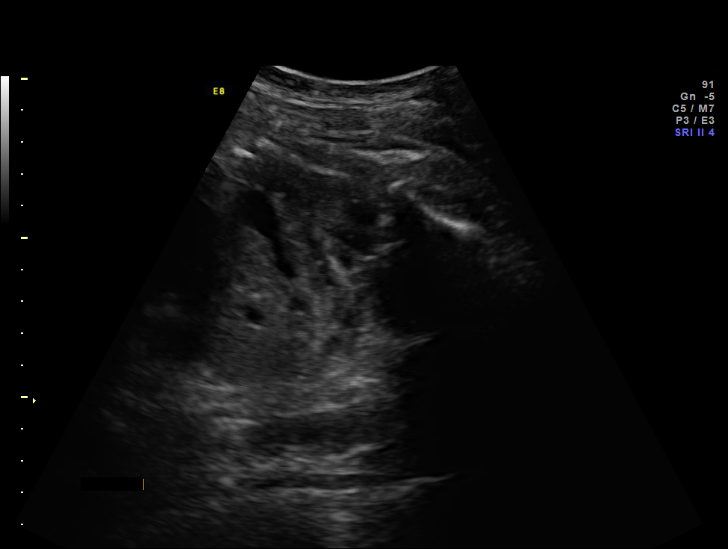
[im 2/17]
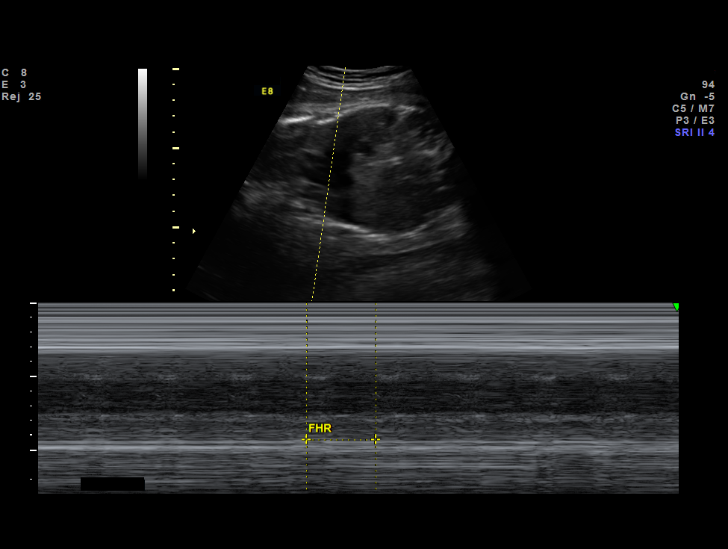
[im 4/17]
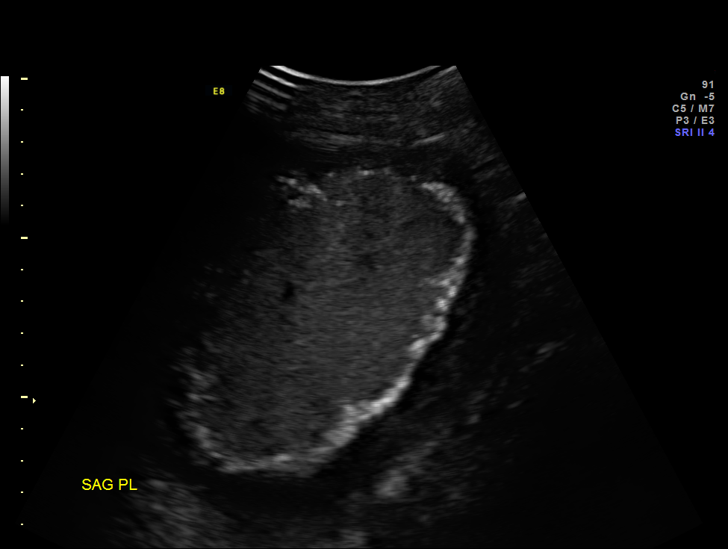
[im 5/17]
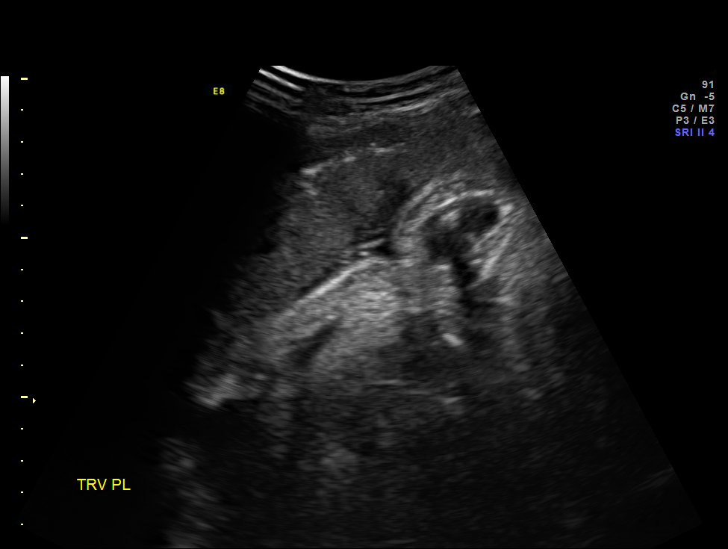
[im 6/17]
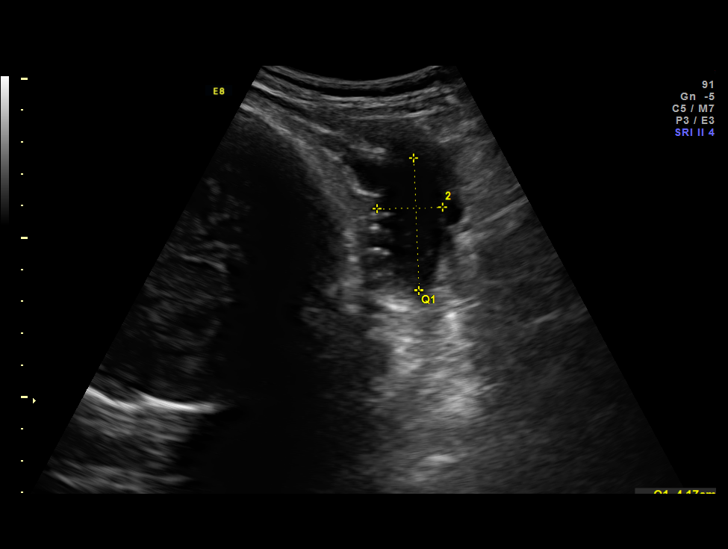
[im 8/17]
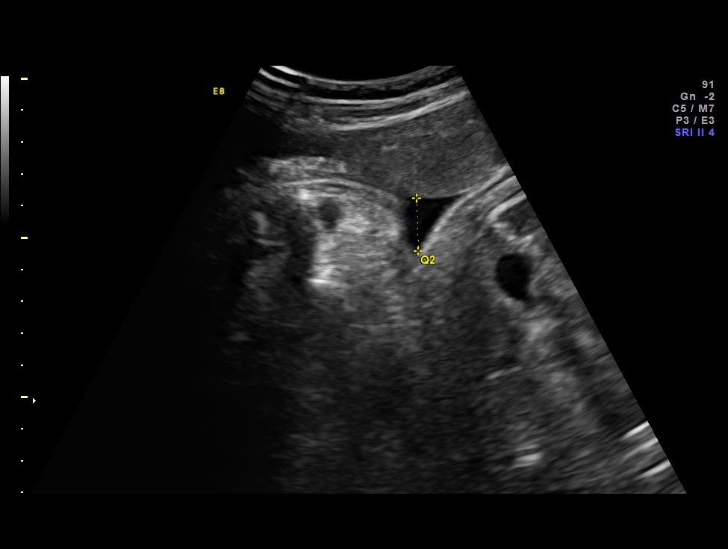
[im 9/17]
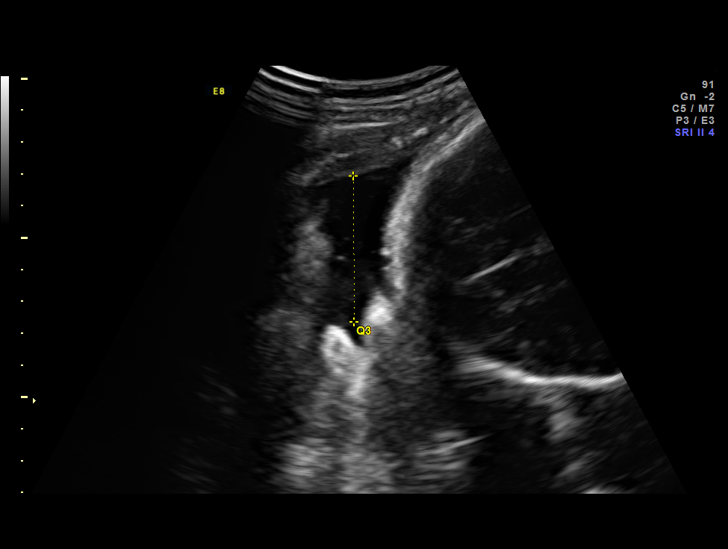
[im 10/17]
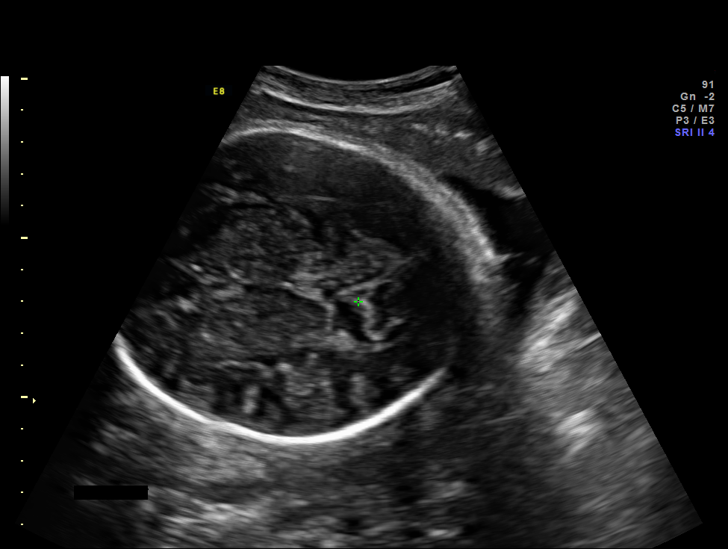
[im 12/17]
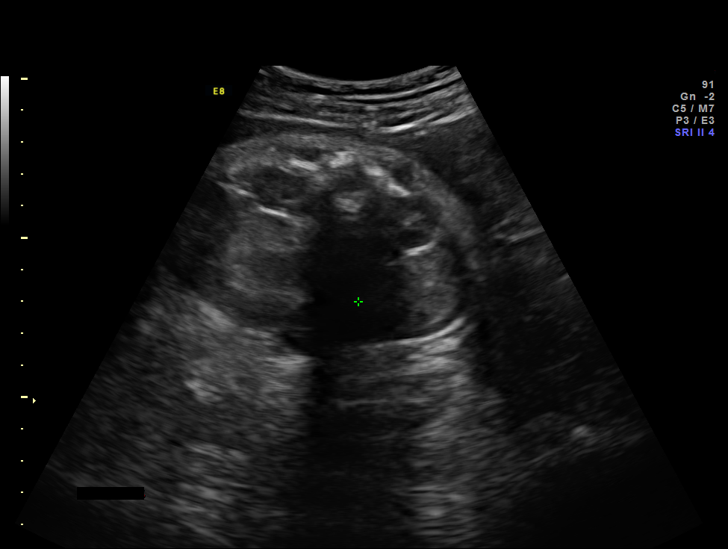
[im 13/17]
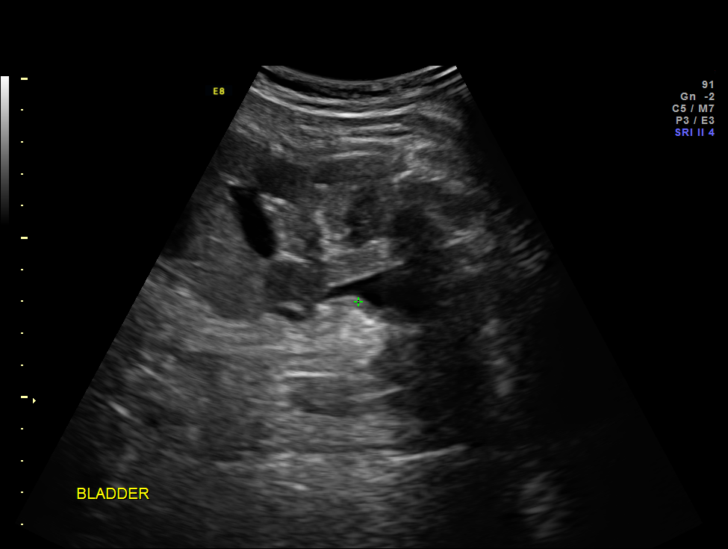
[im 14/17]
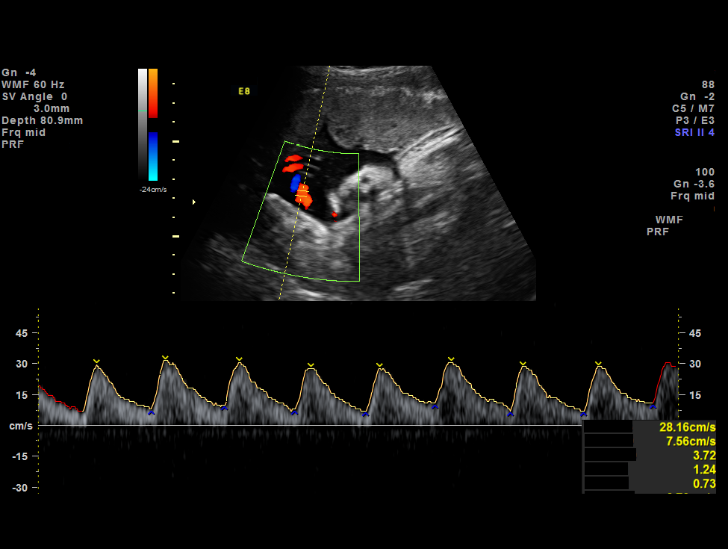
[im 16/17]
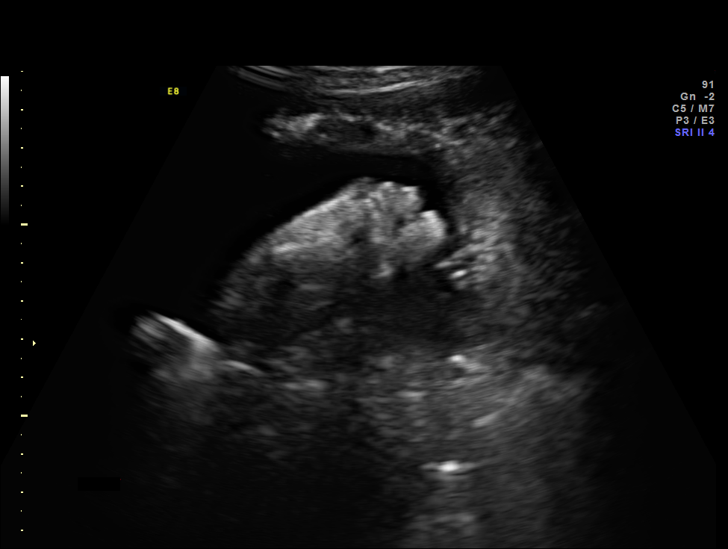
[im 17/17]
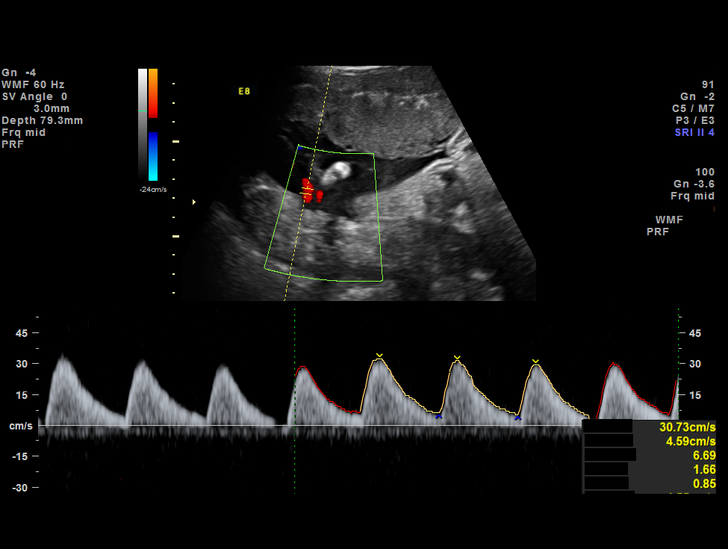

[13 of 17 positions shown; findings below may reference images not displayed]

OBSTETRICS REPORT
                      (Signed Final 06/05/2013 [DATE])

Service(s) Provided

 [HOSPITAL]                                         76815.0
 US UA CORD DOPPLER                                    76820.0
Indications

 Size less than dates; lagging AC: 5th %tile
 Elevated UA dopplers
Fetal Evaluation

 Num Of Fetuses:    1
 Fetal Heart Rate:  145                          bpm
 Cardiac Activity:  Observed
 Presentation:      Frank breech
 Placenta:          Anterior, above cervical os
 P. Cord            Previously Visualized
 Insertion:

 Amniotic Fluid
 AFI FV:      Subjectively within normal limits
 AFI Sum:     10.41   cm       31  %Tile     Larg Pckt:    4.58  cm
 RUQ:   4.17    cm   LUQ:    1.66   cm    LLQ:   4.58    cm
Gestational Age

 Best:          38w 6d     Det. By:  Early Ultrasound         EDD:   06/13/13
Doppler - Fetal Vessels

 Umbilical Artery
 S/D:   4          > 97.5  %tile       RI:
 PI:    1.25                           PSV:       28.73   cm/s

Cervix Uterus Adnexa

 Cervix:       Not visualized (advanced GA >03wks)
Impression

 SIUP at 38+6 weeks
 Frank breech presentation
 Normal amniotic fluid volume
 UA dopplers were elevated for this GA; continuous diastolic
 flow
 NST reactive
Recommendations

 Would deliver by 40 weeks; anytime between 39 and 40
 weeks
 NST on [REDACTED] if not delivering before then

 questions or concerns.

## 2014-08-07 ENCOUNTER — Ambulatory Visit (INDEPENDENT_AMBULATORY_CARE_PROVIDER_SITE_OTHER): Payer: Medicaid Other | Admitting: Obstetrics & Gynecology

## 2014-08-07 ENCOUNTER — Encounter: Payer: Self-pay | Admitting: Obstetrics & Gynecology

## 2014-08-07 ENCOUNTER — Other Ambulatory Visit (HOSPITAL_COMMUNITY)
Admission: RE | Admit: 2014-08-07 | Discharge: 2014-08-07 | Disposition: A | Discharge status: Home / Self Care | Payer: Medicaid Other | Source: Ambulatory Visit | Attending: Obstetrics & Gynecology | Admitting: Obstetrics & Gynecology

## 2014-08-07 VITALS — BP 125/76 | HR 70 | Resp 16 | Ht 65.0 in | Wt 150.0 lb

## 2014-08-07 DIAGNOSIS — R87810 Cervical high risk human papillomavirus (HPV) DNA test positive: Secondary | ICD-10-CM | POA: Insufficient documentation

## 2014-08-07 DIAGNOSIS — L680 Hirsutism: Secondary | ICD-10-CM

## 2014-08-07 DIAGNOSIS — Z01419 Encounter for gynecological examination (general) (routine) without abnormal findings: Secondary | ICD-10-CM | POA: Insufficient documentation

## 2014-08-07 DIAGNOSIS — Z0000 Encounter for general adult medical examination without abnormal findings: Secondary | ICD-10-CM

## 2014-08-07 DIAGNOSIS — Z1151 Encounter for screening for human papillomavirus (HPV): Secondary | ICD-10-CM | POA: Insufficient documentation

## 2014-08-07 MED ORDER — SPIRONOLACTONE 50 MG PO TABS: 50.0000 mg | ORAL_TABLET | Freq: Two times a day (BID) | ORAL | Status: DC

## 2014-08-07 MED ORDER — LIDOCAINE-PRILOCAINE 2.5-2.5 % EX CREA: 1.0000 "application " | TOPICAL_CREAM | CUTANEOUS | Status: DC | PRN

## 2014-08-07 MED ORDER — NORGESTIM-ETH ESTRAD TRIPHASIC 0.18/0.215/0.25 MG-25 MCG PO TABS: 1.0000 | ORAL_TABLET | Freq: Every day | ORAL | Status: DC

## 2014-08-07 NOTE — Progress Notes (Signed)
  Subjective:     Kim [name] is a 28 y.o. female here for a routine exam.  Current complaints: hirsuitism; nausea with OCPs.  Personal health questionnaire reviewed: yes.   Gynecologic History Patient's last menstrual period was 07/09/2014. Contraception: OCP (estrogen/progesterone) Last Pap: unsure. Results were: normal Last mammogram: n/a.   Obstetric History OB History  Gravida Para Term Preterm AB SAB TAB Ectopic Multiple Living  1 1 1       1     # Outcome Date GA Lbr Len/2nd Weight Sex Delivery Anes PTL Lv  1 Term 06/07/13 [redacted]w[redacted]d  5 lb 14 oz (2.665 kg) F CS-LTranv Spinal  Y       The following portions of the patient's history were reviewed and updated as appropriate: allergies, current medications, past family history, past medical history, past social history, past surgical history and problem list.  Review of Systems Pertinent items are noted in HPI.    Objective:      Filed Vitals:   08/07/14 1000  BP: 125/76  Pulse: 70  Resp: 16  Height: 5\' 5"  (1.651 m)  Weight: 150 lb (68.04 kg)   Vitals:  WNL General appearance: alert, cooperative and no distress Head: Normocephalic, without obvious abnormality, atraumatic Eyes: negative Throat: lips, mucosa, and tongue normal; teeth and gums normal Lungs: clear to auscultation bilaterally Breasts: normal appearance, no masses or tenderness, No nipple retraction or dimpling, No nipple discharge or bleeding Heart: regular rate and rhythm Abdomen: soft, non-tender; bowel sounds normal; no masses,  no organomegaly Skin:  Mild hirsutism  Pelvic:  External Genitalia:  Tanner V, no lesion Urethra:No Prolapse Vagina:  Pink, normal rugae, no blood or discharge Cervix:  No CMT, no lesion Uterus:  Normal size and contour, non tender Adnexa:  Normal, no masses, non tender  Extremities: no edema, redness or tenderness in the calves or thighs Skin: no lesions or rash Lymph nodes: Axillary adenopathy: none       Assessment:     Healthy female exam.   Hirsutims   Plan:    Education reviewed: self breast exams and hair removal, pap smear screening intervals. Contraception: OCP (estrogen/progesterone). Follow up in: 1 year. B6 for nausea with pills  Spironolactone for hirsutism Emla for electrolysis

## 2014-08-12 ENCOUNTER — Encounter: Payer: Self-pay | Admitting: Obstetrics & Gynecology

## 2014-08-12 DIAGNOSIS — IMO0002 Reserved for concepts with insufficient information to code with codable children: Secondary | ICD-10-CM | POA: Insufficient documentation

## 2014-08-12 LAB — CYTOLOGY - PAP

## 2014-08-13 ENCOUNTER — Telehealth: Payer: Self-pay | Admitting: *Deleted

## 2014-08-13 ENCOUNTER — Encounter: Payer: Self-pay | Admitting: *Deleted

## 2014-08-13 NOTE — Telephone Encounter (Signed)
-----   Message from Guss Bunde, MD sent at 08/12/2014  6:51 PM EST ----- HPV positive.  Nml cytology.  Needs co testing in 1 year.  RN to call

## 2014-08-13 NOTE — Telephone Encounter (Signed)
Pt notified of normal cytology but HPV on pap repeat pap in 1 year per Dr Gala Romney.

## 2014-11-18 ENCOUNTER — Inpatient Hospital Stay (HOSPITAL_COMMUNITY)
Admission: AD | Admit: 2014-11-18 | Discharge: 2014-11-18 | Disposition: A | Discharge status: Home / Self Care | Payer: Medicaid Other | Source: Ambulatory Visit | Attending: Obstetrics & Gynecology | Admitting: Obstetrics & Gynecology

## 2014-11-18 ENCOUNTER — Encounter (HOSPITAL_COMMUNITY): Payer: Self-pay | Admitting: Medical

## 2014-11-18 ENCOUNTER — Inpatient Hospital Stay (HOSPITAL_COMMUNITY): Payer: Medicaid Other

## 2014-11-18 DIAGNOSIS — N939 Abnormal uterine and vaginal bleeding, unspecified: Secondary | ICD-10-CM | POA: Diagnosis present

## 2014-11-18 DIAGNOSIS — Z141 Cystic fibrosis carrier: Secondary | ICD-10-CM | POA: Insufficient documentation

## 2014-11-18 DIAGNOSIS — O209 Hemorrhage in early pregnancy, unspecified: Secondary | ICD-10-CM

## 2014-11-18 DIAGNOSIS — Z3A01 Less than 8 weeks gestation of pregnancy: Secondary | ICD-10-CM | POA: Insufficient documentation

## 2014-11-18 DIAGNOSIS — Z880 Allergy status to penicillin: Secondary | ICD-10-CM | POA: Diagnosis not present

## 2014-11-18 DIAGNOSIS — O4691 Antepartum hemorrhage, unspecified, first trimester: Secondary | ICD-10-CM | POA: Diagnosis not present

## 2014-11-18 HISTORY — DX: Papillomavirus as the cause of diseases classified elsewhere: B97.7

## 2014-11-18 HISTORY — DX: Unspecified abnormal cytological findings in specimens from vagina: R87.629

## 2014-11-18 LAB — URINALYSIS, ROUTINE W REFLEX MICROSCOPIC
Bilirubin Urine: NEGATIVE
GLUCOSE, UA: NEGATIVE mg/dL
Hgb urine dipstick: NEGATIVE
KETONES UR: NEGATIVE mg/dL
LEUKOCYTES UA: NEGATIVE
Nitrite: NEGATIVE
PH: 5.5 (ref 5.0–8.0)
Protein, ur: NEGATIVE mg/dL
Specific Gravity, Urine: <1.005 — ABNORMAL LOW (ref 1.005–1.030)
Urobilinogen, UA: 0.2 mg/dL (ref 0.0–1.0)

## 2014-11-18 LAB — CBC
HCT: 40.3 % (ref 36.0–46.0)
Hemoglobin: 14 g/dL (ref 12.0–15.0)
MCH: 31.5 pg (ref 26.0–34.0)
MCHC: 34.7 g/dL (ref 30.0–36.0)
MCV: 90.8 fL (ref 78.0–100.0)
Platelets: 297 10*3/uL (ref 150–400)
RBC: 4.44 MIL/uL (ref 3.87–5.11)
RDW: 12.7 % (ref 11.5–15.5)
WBC: 9.1 10*3/uL (ref 4.0–10.5)

## 2014-11-18 LAB — WET PREP, GENITAL
Clue Cells Wet Prep HPF POC: NONE SEEN
Trich, Wet Prep: NONE SEEN
Yeast Wet Prep HPF POC: NONE SEEN

## 2014-11-18 LAB — HCG, QUANTITATIVE, PREGNANCY: hCG, Beta Chain, Quant, S: 713 m[IU]/mL — ABNORMAL HIGH (ref <5)

## 2014-11-18 LAB — POCT PREGNANCY, URINE: Preg Test, Ur: POSITIVE — AB

## 2014-11-18 LAB — ABO/RH: ABO/RH(D): O POS

## 2014-11-18 NOTE — Discharge Instructions (Signed)
Your pregnancy hormone level today was 713. This is to low for a pregnancy to be seen on ultrasound. We will need for you to return in 2 days to repeat the level. With a normal pregnancy the number should double.   Pelvic Rest Pelvic rest is sometimes recommended for women when:   The placenta is partially or completely covering the opening of the cervix (placenta previa).  There is bleeding between the uterine wall and the amniotic sac in the first trimester (subchorionic hemorrhage).  The cervix begins to open without labor starting (incompetent cervix, cervical insufficiency).  The labor is too early (preterm labor). HOME CARE INSTRUCTIONS  Do not have sexual intercourse, stimulation, or an orgasm.  Do not use tampons, douche, or put anything in the vagina.  Do not lift anything over 10 pounds (4.5 kg).  Avoid strenuous activity or straining your pelvic muscles. SEEK MEDICAL CARE IF:  You have any vaginal bleeding during pregnancy. Treat this as a potential emergency.  You have cramping pain felt low in the stomach (stronger than menstrual cramps).  You notice vaginal discharge (watery, mucus, or bloody).  You have a low, dull backache.  There are regular contractions or uterine tightening. SEEK IMMEDIATE MEDICAL CARE IF: You have vaginal bleeding and have placenta previa.  Document Released: 12/04/2010 Document Revised: 11/01/2011 Document Reviewed: 12/04/2010 [facility] Patient Information 2015 [place], [place]. This information is not intended to replace advice given to you by your health care provider. Make sure you discuss any questions you have with your health care provider.   Vaginal Bleeding During Pregnancy, First Trimester A small amount of bleeding (spotting) from the vagina is common in early pregnancy. Sometimes the bleeding is normal and is not a problem, and sometimes it is a sign of something serious. Be sure to tell your doctor about any bleeding from your  vagina right away. HOME CARE  Watch your condition for any changes.  Follow your doctor's instructions about how active you can be.  If you are on bed rest:  You may need to stay in bed and only get up to use the bathroom.  You may be allowed to do some activities.  If you need help, make plans for someone to help you.  Write down:  The number of pads you use each day.  How often you change pads.  How soaked (saturated) your pads are.  Do not use tampons.  Do not douche.  Do not have sex or orgasms until your doctor says it is okay.  If you pass any tissue from your vagina, save the tissue so you can show it to your doctor.  Only take medicines as told by your doctor.  Do not take aspirin because it can make you bleed.  Keep all follow-up visits as told by your doctor. GET HELP IF:   You bleed from your vagina.  You have cramps.  You have labor pains.  You have a fever that does not go away after you take medicine. GET HELP RIGHT AWAY IF:   You have very bad cramps in your back or belly (abdomen).  You pass large clots or tissue from your vagina.  You bleed more.  You feel light-headed or weak.  You pass out (faint).  You have chills.  You are leaking fluid or have a gush of fluid from your vagina.  You pass out while pooping (having a bowel movement). MAKE SURE YOU:  Understand these instructions.  Will watch your condition.  Will get help right away if you are not doing well or get worse. Document Released: 12/24/2013 Document Reviewed: 04/16/2013 [facility] Patient Information 2015 [place]. This information is not intended to replace advice given to you by your health care provider. Make sure you discuss any questions you have with your health care provider.

## 2014-11-18 NOTE — MAU Note (Signed)
Pt reports spotting and cramping off/on x 2 weeks. Positive preg test at home.

## 2014-11-18 NOTE — MAU Provider Note (Signed)
CSN: [id number]     Arrival date & time 11/18/14  7939 History   None    No chief complaint on file.    (Consider location/radiation/quality/duration/timing/severity/associated sxs/prior Treatment) Patient is a 29 y.o. female presenting with vaginal bleeding. The history is provided by the patient.  Vaginal Bleeding This is a new problem. The current episode started 1 to 4 weeks ago. The problem occurs intermittently. The problem has been unchanged. Associated symptoms include abdominal pain (cramping). Pertinent negatives include no chest pain, chills, coughing, fever, headaches, myalgias, nausea, rash, sore throat, swollen glands or vomiting. Nothing aggravates the symptoms. She has tried nothing for the symptoms.   Kim [name] is a 30 y.o. G2P1001 @ [redacted]w[redacted]d gestation who presents to MAU vaginal bleeding. She states that over the past 2 weeks she has had bleeding off and on. The bleeding is less than a period. She has been wearing a panty liner. She has had lower abdominal cramping. She has an appointment with Dr. Gala Romney next week in the [place] office. She called and told them the symptoms and they suggested she come to MAU for evaluation.   Past Medical History  Diagnosis Date  . Fibroid     1.9cm  . 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 Anxiety   . GERD (gastroesophageal reflux disease)   . Vaginal Pap smear, abnormal   . HPV (human papilloma virus) infection    Past Surgical History  Procedure Laterality Date  . No past surgeries    . Cesarean section N/A 06/07/2013    Procedure: PRIMARY CESAREAN SECTION;  Surgeon: Shelly Bombard, MD;  Location: [facility];  Service: Obstetrics;  Laterality: N/A;   Family History  Problem Relation Age of Onset  . Diabetes Father   . Diabetes Maternal Grandmother    History  Substance Use Topics  . Smoking status: Never Smoker   . Smokeless tobacco: Never Used  . Alcohol Use: 0.0 oz/week    0 Standard drinks or equivalent per week      Comment: rare   OB History    Gravida Para Term Preterm AB TAB SAB Ectopic Multiple Living   2 1 1       1      Review of Systems  Constitutional: Negative for fever and chills.  HENT: Negative.  Negative for sore throat.   Eyes: Negative for pain, itching and visual disturbance.  Respiratory: Negative for cough, shortness of breath and wheezing.   Cardiovascular: Negative for chest pain and palpitations.  Gastrointestinal: Positive for abdominal pain (cramping). Negative for nausea, vomiting, diarrhea and constipation.  Genitourinary: Positive for frequency and vaginal bleeding. Negative for dysuria, urgency and vaginal discharge.  Musculoskeletal: Negative for myalgias.  Skin: Negative for rash.  Neurological: Negative for syncope and headaches.  Psychiatric/Behavioral: Negative for confusion. The patient is not nervous/anxious.       Allergies  Naproxen and Penicillins  Home Medications   Prior to Admission medications   Medication Sig Start Date End Date Taking? Authorizing Provider  b complex vitamins tablet Take 1 tablet by mouth daily.   Yes Historical Provider, MD  BETA CAROTENE PO Take 1 tablet by mouth daily.    Yes Historical Provider, MD  CALCIUM-VITAMIN D PO Take 1 tablet by mouth daily.   Yes Historical Provider, MD  folic acid (FOLVITE) 030 MCG tablet Take 800 mcg by mouth daily.    Yes Historical Provider, MD  Lactobacillus (ACIDOPHILUS PO) Take 1 capsule by mouth daily.  Yes Historical Provider, MD  vitamin E 200 UNIT capsule Take 200 Units by mouth daily.   Yes Historical Provider, MD  ibuprofen (ADVIL,MOTRIN) 800 MG tablet Take 1 tablet (800 mg total) by mouth every 8 (eight) hours as needed for pain. Patient not taking: Reported on 11/18/2014 06/14/13   Shelly Bombard, MD  lidocaine-prilocaine (EMLA) cream Apply 1 application topically as needed. Patient not taking: Reported on 11/18/2014 08/07/14   Guss Bunde, MD  Norgestimate-Ethinyl Estradiol  Triphasic (ORTHO TRI-CYCLEN LO) 0.18/0.215/0.25 MG-25 MCG tab Take 1 tablet by mouth daily. Patient not taking: Reported on 11/18/2014 08/07/14   Guss Bunde, MD  spironolactone (ALDACTONE) 50 MG tablet Take 1 tablet (50 mg total) by mouth 2 (two) times daily. Will start with 50 mg twice daily, and increase to 100 mg twice daily as needed Patient not taking: Reported on 11/18/2014 08/07/14   Guss Bunde, MD   BP 107/77 mmHg  Pulse 92  Temp(Src) 99.1 F (37.3 C) (Oral)  Resp 16  Ht 5\' 5"  (1.651 m)  Wt 149 lb (67.586 kg)  BMI 24.79 kg/m2  SpO2 100%  LMP 09/27/2014 Physical Exam  Constitutional: She is oriented to person, place, and time. She appears well-developed and well-nourished. No distress.  HENT:  Head: Normocephalic.  Eyes: EOM are normal.  Neck: Neck supple.  Cardiovascular: Normal rate.   Pulmonary/Chest: Effort normal.  Abdominal: Soft. There is no tenderness.  Genitourinary:  External genitalia without lesions, scant blood vaginal vault, cervix closed, long. No CMT, no adnexal tenderness or mass palpated. Uterus without palpable enlargement.   Musculoskeletal: Normal range of motion.  Neurological: She is alert and oriented to person, place, and time. No cranial nerve deficit.  Skin: Skin is warm and dry.  Psychiatric: She has a normal mood and affect. Her behavior is normal.  Nursing note and vitals reviewed.   ED Course  Procedures (including critical care time) Labs, ultrasound, exam  Labs Review Results for orders placed or performed during the hospital encounter of 11/18/14 (from the past 24 hour(s))  Urinalysis, Routine w reflex microscopic     Status: Abnormal   Collection Time: 11/18/14  7:30 PM  Result Value Ref Range   Color, Urine YELLOW YELLOW   APPearance CLEAR CLEAR   Specific Gravity, Urine <1.005 (L) 1.005 - 1.030   pH 5.5 5.0 - 8.0   Glucose, UA NEGATIVE NEGATIVE mg/dL   Hgb urine dipstick NEGATIVE NEGATIVE   Bilirubin Urine NEGATIVE  NEGATIVE   Ketones, ur NEGATIVE NEGATIVE mg/dL   Protein, ur NEGATIVE NEGATIVE mg/dL   Urobilinogen, UA 0.2 0.0 - 1.0 mg/dL   Nitrite NEGATIVE NEGATIVE   Leukocytes, UA NEGATIVE NEGATIVE  Pregnancy, urine POC     Status: Abnormal   Collection Time: 11/18/14  7:39 PM  Result Value Ref Range   Preg Test, Ur POSITIVE (A) NEGATIVE  CBC     Status: None   Collection Time: 11/18/14  8:36 PM  Result Value Ref Range   WBC 9.1 4.0 - 10.5 K/uL   RBC 4.44 3.87 - 5.11 MIL/uL   Hemoglobin 14.0 12.0 - 15.0 g/dL   HCT 40.3 36.0 - 46.0 %   MCV 90.8 78.0 - 100.0 fL   MCH 31.5 26.0 - 34.0 pg   MCHC 34.7 30.0 - 36.0 g/dL   RDW 12.7 11.5 - 15.5 %   Platelets 297 150 - 400 K/uL  ABO/Rh     Status: None   Collection Time: 11/18/14  8:36 PM  Result Value Ref Range   ABO/RH(D) O POS   hCG, quantitative, pregnancy     Status: Abnormal   Collection Time: 11/18/14  8:36 PM  Result Value Ref Range   hCG, Beta Chain, Quant, S 713 (H) <5 mIU/mL   US Ob Comp Less 14 Wks  11/18/2014   CLINICAL DATA:  Subacute onset of vaginal spotting and pelvic cramping for 2 weeks. Initial encounter.  EXAM: OBSTETRIC <14 WK [place] AND TRANSVAGINAL OB US  TECHNIQUE: Both transabdominal and transvaginal ultrasound examinations were performed for complete evaluation of the gestation as well as the maternal uterus, adnexal regions, and pelvic cul-de-sac. Transvaginal technique was performed to assess early pregnancy.  COMPARISON:  Pelvic ultrasound performed 06/05/2013  FINDINGS: Intrauterine gestational sac: None seen.  Yolk sac:  N/A  Embryo:  N/A  Maternal uterus/adnexae: The endometrial echo complex is slightly irregular in contour, though no focal mass is seen. This may remain within normal limits.  The right ovary is unremarkable in appearance, measuring 4.4 x 2.7 x 2.8 cm. A 5.3 x 4.9 x 5.3 cm anechoic cyst is noted at the left adnexa. The left ovary measures approximately 7.1 x 5.4 x 6.5 cm. There is no evidence for ovarian  torsion. Limited Doppler evaluation demonstrates normal color Doppler blood flow with regard to both ovaries.  No free fluid is seen within the pelvic cul-de-sac.  IMPRESSION: 1. No intrauterine gestational sac seen at this time. No ectopic pregnancy seen. This remains within normal limits, given the quantitative beta HCG level of 713. If the patient's symptoms persist, and if the quantitative beta HCG level continues to rise, would perform follow-up pelvic ultrasound in 2 weeks for further evaluation. 2. 5.3 cm left adnexal cyst is likely physiologic in nature. After completion of the current pregnancy, follow-up pelvic ultrasound could be considered, as deemed clinically appropriate.   Electronically Signed   By: Garald Balding M.D.   On: 11/18/2014 21:47   US Ob Transvaginal  11/18/2014   CLINICAL DATA:  Subacute onset of vaginal spotting and pelvic cramping for 2 weeks. Initial encounter.  EXAM: OBSTETRIC <14 WK [place] AND TRANSVAGINAL OB US  TECHNIQUE: Both transabdominal and transvaginal ultrasound examinations were performed for complete evaluation of the gestation as well as the maternal uterus, adnexal regions, and pelvic cul-de-sac. Transvaginal technique was performed to assess early pregnancy.  COMPARISON:  Pelvic ultrasound performed 06/05/2013  FINDINGS: Intrauterine gestational sac: None seen.  Yolk sac:  N/A  Embryo:  N/A  Maternal uterus/adnexae: The endometrial echo complex is slightly irregular in contour, though no focal mass is seen. This may remain within normal limits.  The right ovary is unremarkable in appearance, measuring 4.4 x 2.7 x 2.8 cm. A 5.3 x 4.9 x 5.3 cm anechoic cyst is noted at the left adnexa. The left ovary measures approximately 7.1 x 5.4 x 6.5 cm. There is no evidence for ovarian torsion. Limited Doppler evaluation demonstrates normal color Doppler blood flow with regard to both ovaries.  No free fluid is seen within the pelvic cul-de-sac.  IMPRESSION: 1. No intrauterine  gestational sac seen at this time. No ectopic pregnancy seen. This remains within normal limits, given the quantitative beta HCG level of 713. If the patient's symptoms persist, and if the quantitative beta HCG level continues to rise, would perform follow-up pelvic ultrasound in 2 weeks for further evaluation. 2. 5.3 cm left adnexal cyst is likely physiologic in nature. After completion of the current pregnancy, follow-up pelvic  ultrasound could be considered, as deemed clinically appropriate.   Electronically Signed   By: Garald Balding M.D.   On: 11/18/2014 21:47    MDM  29 y.o. G2P1001 @ [redacted]w[redacted]d gestation by LMP with vaginal bleeding stable for d/c without hemorrhage or pain at this time. Patient to return in 48 hours for follow up Bhcg. Ectopic precautions given. I have reviewed this patient's vital signs, nurses notes, appropriate labs and imaging.  I have discussed findings and plan of care with the patient and she voices understanding and agrees with plan. She will return sooner than 48 hours if problems arise.  Diff Dx: SAB vs early IUP vs ectopic  Final diagnoses:  Vaginal bleeding in pregnancy, first trimester

## 2014-11-19 LAB — GC/CHLAMYDIA PROBE AMP (~~LOC~~) NOT AT ARMC
Chlamydia: NEGATIVE
Neisseria Gonorrhea: NEGATIVE

## 2014-11-19 LAB — HIV ANTIBODY (ROUTINE TESTING W REFLEX): HIV SCREEN 4TH GENERATION: NONREACTIVE

## 2014-11-19 LAB — RPR: RPR: NONREACTIVE

## 2014-11-20 ENCOUNTER — Inpatient Hospital Stay (HOSPITAL_COMMUNITY)
Admission: AD | Admit: 2014-11-20 | Discharge: 2014-11-20 | Disposition: A | Discharge status: Home / Self Care | Payer: Medicaid Other | Source: Ambulatory Visit | Attending: Obstetrics and Gynecology | Admitting: Obstetrics and Gynecology

## 2014-11-20 DIAGNOSIS — R109 Unspecified abdominal pain: Secondary | ICD-10-CM | POA: Diagnosis not present

## 2014-11-20 DIAGNOSIS — O9989 Other specified diseases and conditions complicating pregnancy, childbirth and the puerperium: Secondary | ICD-10-CM | POA: Insufficient documentation

## 2014-11-20 DIAGNOSIS — O4691 Antepartum hemorrhage, unspecified, first trimester: Secondary | ICD-10-CM | POA: Diagnosis not present

## 2014-11-20 DIAGNOSIS — Z3A01 Less than 8 weeks gestation of pregnancy: Secondary | ICD-10-CM | POA: Diagnosis not present

## 2014-11-20 DIAGNOSIS — O209 Hemorrhage in early pregnancy, unspecified: Secondary | ICD-10-CM

## 2014-11-20 LAB — HCG, SERUM, QUALITATIVE: PREG SERUM: POSITIVE — AB

## 2014-11-20 LAB — HCG, QUANTITATIVE, PREGNANCY: HCG, BETA CHAIN, QUANT, S: 1840 m[IU]/mL — AB (ref <5)

## 2014-11-20 NOTE — MAU Note (Signed)
Her for follow up hcg. Vaginal bleeding has decreased since her last visit and is now just small amount of spotting. Mild abdominal cramping is no change from previous visit. No other complaints.

## 2014-11-20 NOTE — Discharge Instructions (Signed)
Pelvic Rest Pelvic rest is sometimes recommended for women when:   The placenta is partially or completely covering the opening of the cervix (placenta previa).  There is bleeding between the uterine wall and the amniotic sac in the first trimester (subchorionic hemorrhage).  The cervix begins to open without labor starting (incompetent cervix, cervical insufficiency).  The labor is too early (preterm labor). HOME CARE INSTRUCTIONS  Do not have sexual intercourse, stimulation, or an orgasm.  Do not use tampons, douche, or put anything in the vagina.  Do not lift anything over 10 pounds (4.5 kg).  Avoid strenuous activity or straining your pelvic muscles. SEEK MEDICAL CARE IF:  You have any vaginal bleeding during pregnancy. Treat this as a potential emergency.  You have cramping pain felt low in the stomach (stronger than menstrual cramps).  You notice vaginal discharge (watery, mucus, or bloody).  You have a low, dull backache.  There are regular contractions or uterine tightening. SEEK IMMEDIATE MEDICAL CARE IF: You have vaginal bleeding and have placenta previa.  Document Released: 12/04/2010 Document Revised: 11/01/2011 Document Reviewed: 12/04/2010 [facility] Patient Information 2015 [place], [place]. This information is not intended to replace advice given to you by your health care provider. Make sure you discuss any questions you have with your health care provider.  Vaginal Bleeding During Pregnancy, First Trimester A small amount of bleeding (spotting) from the vagina is common in early pregnancy. Sometimes the bleeding is normal and is not a problem, and sometimes it is a sign of something serious. Be sure to tell your doctor about any bleeding from your vagina right away. HOME CARE  Watch your condition for any changes.  Follow your doctor's instructions about how active you can be.  If you are on bed rest:  You may need to stay in bed and only get up to use the  bathroom.  You may be allowed to do some activities.  If you need help, make plans for someone to help you.  Write down:  The number of pads you use each day.  How often you change pads.  How soaked (saturated) your pads are.  Do not use tampons.  Do not douche.  Do not have sex or orgasms until your doctor says it is okay.  If you pass any tissue from your vagina, save the tissue so you can show it to your doctor.  Only take medicines as told by your doctor.  Do not take aspirin because it can make you bleed.  Keep all follow-up visits as told by your doctor. GET HELP IF:   You bleed from your vagina.  You have cramps.  You have labor pains.  You have a fever that does not go away after you take medicine. GET HELP RIGHT AWAY IF:   You have very bad cramps in your back or belly (abdomen).  You pass large clots or tissue from your vagina.  You bleed more.  You feel light-headed or weak.  You pass out (faint).  You have chills.  You are leaking fluid or have a gush of fluid from your vagina.  You pass out while pooping (having a bowel movement). MAKE SURE YOU:  Understand these instructions.  Will watch your condition.  Will get help right away if you are not doing well or get worse. Document Released: 12/24/2013 Document Reviewed: 04/16/2013 [facility] Patient Information 2015 [place]. This information is not intended to replace advice given to you by your health care provider. Make  sure you discuss any questions you have with your health care provider.

## 2014-11-20 NOTE — MAU Provider Note (Signed)
Ms. Kim [name]  is a 29 y.o. G2P1001 at [redacted]w[redacted]d who presents to MAU today for follow-up quant hCG after 48 hours. She states improvement in bleeding since last visit. She is now only spotting. She states mild abdominal cramping also improved from last visit.   BP 126/65 mmHg  Pulse 85  Temp(Src) 99.3 F (37.4 C) (Oral)  Resp 18  SpO2 100%  LMP 09/27/2014  Breastfeeding? No  GENERAL: Well-developed, well-nourished female in no acute distress.  HEENT: Normocephalic, atraumatic.   LUNGS: Effort normal HEART: Regular rate  SKIN: Warm, dry and without erythema PSYCH: Normal mood and affect Results for Kim, [name] (MRN [id number]) as of 11/21/2014 00:04  Ref. Range 11/18/2014 20:36 11/18/2014 20:58 11/18/2014 22:00 11/20/2014 21:45  hCG, Beta Chain, Quant, S Latest Range: <5 mIU/mL 713 (H)   1840 (H)   A: Appropriate rise in quant hCG after 48 hours  P: Discharge home First trimester/ectopic precautions discussed Patient will return for follow-up US in 1 week. Order placed. They will call the patient with an appointment time Patient has an appointment with [place] on Monday. Advised to discuss with Dr. Gala Romney whether follow-up US should be performed at [facility] or at [facility] Patient may return to MAU as needed or if her condition were to change or worsen   Luvenia Redden, PA-C 11/21/2014 12:04 AM

## 2014-11-25 ENCOUNTER — Encounter (HOSPITAL_COMMUNITY): Payer: Self-pay | Admitting: Advanced Practice Midwife

## 2014-11-25 ENCOUNTER — Inpatient Hospital Stay (HOSPITAL_COMMUNITY)
Admission: AD | Admit: 2014-11-25 | Discharge: 2014-11-25 | Disposition: A | Discharge status: Home / Self Care | Payer: Medicaid Other | Source: Ambulatory Visit | Attending: Obstetrics & Gynecology | Admitting: Obstetrics & Gynecology

## 2014-11-25 ENCOUNTER — Inpatient Hospital Stay (HOSPITAL_COMMUNITY): Payer: Medicaid Other

## 2014-11-25 ENCOUNTER — Encounter: Payer: Self-pay | Admitting: Advanced Practice Midwife

## 2014-11-25 ENCOUNTER — Ambulatory Visit (INDEPENDENT_AMBULATORY_CARE_PROVIDER_SITE_OTHER): Payer: Medicaid Other | Admitting: Advanced Practice Midwife

## 2014-11-25 VITALS — BP 108/71 | HR 77 | Wt 150.0 lb

## 2014-11-25 DIAGNOSIS — O0281 Inappropriate change in quantitative human chorionic gonadotropin (hCG) in early pregnancy: Secondary | ICD-10-CM | POA: Diagnosis not present

## 2014-11-25 DIAGNOSIS — Z3490 Encounter for supervision of normal pregnancy, unspecified, unspecified trimester: Secondary | ICD-10-CM

## 2014-11-25 DIAGNOSIS — O26891 Other specified pregnancy related conditions, first trimester: Secondary | ICD-10-CM

## 2014-11-25 DIAGNOSIS — R109 Unspecified abdominal pain: Secondary | ICD-10-CM | POA: Diagnosis not present

## 2014-11-25 DIAGNOSIS — O3680X1 Pregnancy with inconclusive fetal viability, fetus 1: Secondary | ICD-10-CM | POA: Diagnosis not present

## 2014-11-25 DIAGNOSIS — O9989 Other specified diseases and conditions complicating pregnancy, childbirth and the puerperium: Secondary | ICD-10-CM | POA: Diagnosis present

## 2014-11-25 DIAGNOSIS — R188 Other ascites: Secondary | ICD-10-CM | POA: Diagnosis not present

## 2014-11-25 DIAGNOSIS — R102 Pelvic and perineal pain: Secondary | ICD-10-CM

## 2014-11-25 DIAGNOSIS — N8320 Unspecified ovarian cysts: Secondary | ICD-10-CM | POA: Diagnosis not present

## 2014-11-25 DIAGNOSIS — Z3A08 8 weeks gestation of pregnancy: Secondary | ICD-10-CM | POA: Diagnosis not present

## 2014-11-25 DIAGNOSIS — O3481 Maternal care for other abnormalities of pelvic organs, first trimester: Secondary | ICD-10-CM

## 2014-11-25 DIAGNOSIS — N83209 Unspecified ovarian cyst, unspecified side: Secondary | ICD-10-CM

## 2014-11-25 DIAGNOSIS — O3680X0 Pregnancy with inconclusive fetal viability, not applicable or unspecified: Secondary | ICD-10-CM

## 2014-11-25 LAB — HCG, QUANTITATIVE, PREGNANCY: hCG, Beta Chain, Quant, S: 9703 m[IU]/mL — ABNORMAL HIGH (ref <5)

## 2014-11-25 NOTE — Discharge Instructions (Signed)

## 2014-11-25 NOTE — Patient Instructions (Signed)
First Trimester of Pregnancy The first trimester of pregnancy is from week 1 until the end of week 12 (months 1 through 3). A week after a sperm fertilizes an egg, the egg will implant on the wall of the uterus. This embryo will begin to develop into a baby. Genes from you and your partner are forming the baby. The female genes determine whether the baby is a boy or a girl. At 6-8 weeks, the eyes and face are formed, and the heartbeat can be seen on ultrasound. At the end of 12 weeks, all the baby's organs are formed.  Now that you are pregnant, you will want to do everything you can to have a healthy baby. Two of the most important things are to get good prenatal care and to follow your health care provider's instructions. Prenatal care is all the medical care you receive before the baby's birth. This care will help prevent, find, and treat any problems during the pregnancy and childbirth. BODY CHANGES Your body goes through many changes during pregnancy. The changes vary from woman to woman.   You may gain or lose a couple of pounds at first.  You may feel sick to your stomach (nauseous) and throw up (vomit). If the vomiting is uncontrollable, call your health care provider.  You may tire easily.  You may develop headaches that can be relieved by medicines approved by your health care provider.  You may urinate more often. Painful urination may mean you have a bladder infection.  You may develop heartburn as a result of your pregnancy.  You may develop constipation because certain hormones are causing the muscles that push waste through your intestines to slow down.  You may develop hemorrhoids or swollen, bulging veins (varicose veins).  Your breasts may begin to grow larger and become tender. Your nipples may stick out more, and the tissue that surrounds them (areola) may become darker.  Your gums may bleed and may be sensitive to brushing and flossing.  Dark spots or blotches (chloasma,  mask of pregnancy) may develop on your face. This will likely fade after the baby is born.  Your menstrual periods will stop.  You may have a loss of appetite.  You may develop cravings for certain kinds of food.  You may have changes in your emotions from day to day, such as being excited to be pregnant or being concerned that something may go wrong with the pregnancy and baby.  You may have more vivid and strange dreams.  You may have changes in your hair. These can include thickening of your hair, rapid growth, and changes in texture. Some women also have hair loss during or after pregnancy, or hair that feels dry or thin. Your hair will most likely return to normal after your baby is born. WHAT TO EXPECT AT YOUR PRENATAL VISITS During a routine prenatal visit:  You will be weighed to make sure you and the baby are growing normally.  Your blood pressure will be taken.  Your abdomen will be measured to track your baby's growth.  The fetal heartbeat will be listened to starting around week 10 or 12 of your pregnancy.  Test results from any previous visits will be discussed. Your health care provider may ask you:  How you are feeling.  If you are feeling the baby move.  If you have had any abnormal symptoms, such as leaking fluid, bleeding, severe headaches, or abdominal cramping.  If you have any questions. Other tests   that may be performed during your first trimester include:  Blood tests to find your blood type and to check for the presence of any previous infections. They will also be used to check for low iron levels (anemia) and Rh antibodies. Later in the pregnancy, blood tests for diabetes will be done along with other tests if problems develop.  Urine tests to check for infections, diabetes, or protein in the urine.  An ultrasound to confirm the proper growth and development of the baby.  An amniocentesis to check for possible genetic problems.  Fetal screens for  spina bifida and Down syndrome.  You may need other tests to make sure you and the baby are doing well. HOME CARE INSTRUCTIONS  Medicines  Follow your health care provider's instructions regarding medicine use. Specific medicines may be either safe or unsafe to take during pregnancy.  Take your prenatal vitamins as directed.  If you develop constipation, try taking a stool softener if your health care provider approves. Diet  Eat regular, well-balanced meals. Choose a variety of foods, such as meat or vegetable-based protein, fish, milk and low-fat dairy products, vegetables, fruits, and whole grain breads and cereals. Your health care provider will help you determine the amount of weight gain that is right for you.  Avoid raw meat and uncooked cheese. These carry germs that can cause birth defects in the baby.  Eating four or five small meals rather than three large meals a day may help relieve nausea and vomiting. If you start to feel nauseous, eating a few soda crackers can be helpful. Drinking liquids between meals instead of during meals also seems to help nausea and vomiting.  If you develop constipation, eat more high-fiber foods, such as fresh vegetables or fruit and whole grains. Drink enough fluids to keep your urine clear or pale yellow. Activity and Exercise  Exercise only as directed by your health care provider. Exercising will help you:  Control your weight.  Stay in shape.  Be prepared for labor and delivery.  Experiencing pain or cramping in the lower abdomen or low back is a good sign that you should stop exercising. Check with your health care provider before continuing normal exercises.  Try to avoid standing for long periods of time. Move your legs often if you must stand in one place for a long time.  Avoid heavy lifting.  Wear low-heeled shoes, and practice good posture.  You may continue to have sex unless your health care provider directs you  otherwise. Relief of Pain or Discomfort  Wear a good support bra for breast tenderness.   Take warm sitz baths to soothe any pain or discomfort caused by hemorrhoids. Use hemorrhoid cream if your health care provider approves.   Rest with your legs elevated if you have leg cramps or low back pain.  If you develop varicose veins in your legs, wear support hose. Elevate your feet for 15 minutes, 3-4 times a day. Limit salt in your diet. Prenatal Care  Schedule your prenatal visits by the twelfth week of pregnancy. They are usually scheduled monthly at first, then more often in the last 2 months before delivery.  Write down your questions. Take them to your prenatal visits.  Keep all your prenatal visits as directed by your health care provider. Safety  Wear your seat belt at all times when driving.  Make a list of emergency phone numbers, including numbers for family, friends, the hospital, and police and fire departments. General Tips    Ask your health care provider for a referral to a local prenatal education class. Begin classes no later than at the beginning of month 6 of your pregnancy.  Ask for help if you have counseling or nutritional needs during pregnancy. Your health care provider can offer advice or refer you to specialists for help with various needs.  Do not use hot tubs, steam rooms, or saunas.  Do not douche or use tampons or scented sanitary pads.  Do not cross your legs for long periods of time.  Avoid cat litter boxes and soil used by cats. These carry germs that can cause birth defects in the baby and possibly loss of the fetus by miscarriage or stillbirth.  Avoid all smoking, herbs, alcohol, and medicines not prescribed by your health care provider. Chemicals in these affect the formation and growth of the baby.  Schedule a dentist appointment. At home, brush your teeth with a soft toothbrush and be gentle when you floss. SEEK MEDICAL CARE IF:   You have  dizziness.  You have mild pelvic cramps, pelvic pressure, or nagging pain in the abdominal area.  You have persistent nausea, vomiting, or diarrhea.  You have a bad smelling vaginal discharge.  You have pain with urination.  You notice increased swelling in your face, hands, legs, or ankles. SEEK IMMEDIATE MEDICAL CARE IF:   You have a fever.  You are leaking fluid from your vagina.  You have spotting or bleeding from your vagina.  You have severe abdominal cramping or pain.  You have rapid weight gain or loss.  You vomit blood or material that looks like coffee grounds.  You are exposed to German measles and have never had them.  You are exposed to fifth disease or chickenpox.  You develop a severe headache.  You have shortness of breath.  You have any kind of trauma, such as from a fall or a car accident. Document Released: 08/03/2001 Document Revised: 12/24/2013 Document Reviewed: 06/19/2013 Exit[employer] Patient Information 2015 [employer]. This information is not intended to replace advice given to you by your health care provider. Make sure you discuss any questions you have with your health care provider.  

## 2014-11-25 NOTE — Progress Notes (Signed)
New OB.  Seen first by Sherryle Lis RN for Ultrasound. Small sac seen in uterus but no yolk sac or embryo. There was a large fluid collection in left adnexa.  Cannot rule out ectopic yet. Patient does not have pain there but c/o fullness feeling. No bleeding. Recommend repeat Quant and [place] at [facility] today.  Report given to J Rasch NP.

## 2014-11-25 NOTE — MAU Provider Note (Signed)
History     CSN: [id number]  Arrival date and time: 11/25/14 1226   None     Chief Complaint  Patient presents with  . Repeat BHCG and [place]    HPI   Ms. Kim [name] is a 29 y.o. female G2P1001 at [redacted]w[redacted]d who presents from primary OB office for [place] to evaluate progression of pregnancy.  She was sent over by the CNM at [place] after a questionable fluid collection was seen on the office [place]. She was there today for her new OB appointment. She was seen in MAU a few days prior on 3/28 with scant vaginal bleeding; she had appropriate rising quants.   Currently in MAU she denies vaginal bleeding and/or pain. She has occasional, lower abdominal cramping that is similar to "gas pain."    CNM note from office:   Expand All Collapse All   New OB. Seen first by Sherryle Lis RN for Ultrasound. Small sac seen in uterus but no yolk sac or embryo. There was a large fluid collection in left adnexa. Cannot rule out ectopic yet. Patient does not have pain there but c/o fullness feeling. No bleeding. Recommend repeat Quant and [place] at [facility] today. Report given to J Rasch NP.        OB History    Gravida Para Term Preterm AB TAB SAB Ectopic Multiple Living   2 1 1       1       Past Medical History  Diagnosis Date  . Fibroid     1.9cm  . 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 Anxiety   . GERD (gastroesophageal reflux disease)   . Vaginal Pap smear, abnormal   . HPV (human papilloma virus) infection     Past Surgical History  Procedure Laterality Date  . No past surgeries    . Cesarean section N/A 06/07/2013    Procedure: PRIMARY CESAREAN SECTION;  Surgeon: Shelly Bombard, MD;  Location: [facility];  Service: Obstetrics;  Laterality: N/A;    Family History  Problem Relation Age of Onset  . Diabetes Father   . Diabetes Maternal Grandmother     History  Substance Use Topics  . Smoking status: Never Smoker   . Smokeless tobacco: Never Used  . Alcohol Use: 0.0 oz/week    0  Standard drinks or equivalent per week     Comment: rare    Allergies:  Allergies  Allergen Reactions  . Naproxen Anaphylaxis    Can take ibuprofen  . Penicillins Other (See Comments)    Family history of allergy, patient prefers to not take this medication    Prescriptions prior to admission  Medication Sig Dispense Refill Last Dose  . b complex vitamins tablet Take 1 tablet by mouth daily.   11/18/2014 at Unknown time  . BETA CAROTENE PO Take 1 tablet by mouth daily.    11/18/2014 at Unknown time  . CALCIUM-VITAMIN D PO Take 1 tablet by mouth daily.   11/18/2014 at Unknown time  . folic acid (FOLVITE) 740 MCG tablet Take 800 mcg by mouth daily.    11/18/2014 at Unknown time  . Lactobacillus (ACIDOPHILUS PO) Take 1 capsule by mouth daily.    11/18/2014 at Unknown time  . Prenatal MV-Min-Fe Fum-FA-DHA (PRENATAL 1 PO) Take by mouth.     . vitamin E 200 UNIT capsule Take 200 Units by mouth daily.   11/18/2014 at Unknown time   Results for orders placed or performed during the hospital encounter  of 11/25/14 (from the past 48 hour(s))  hCG, quantitative, pregnancy     Status: Abnormal   Collection Time: 11/25/14  1:00 PM  Result Value Ref Range   hCG, Beta Chain, Quant, S 9703 (H) <5 mIU/mL    Comment:          GEST. AGE      CONC.  (mIU/mL)   <=1 WEEK        5 - 50     2 WEEKS       50 - 500     3 WEEKS       100 - 10,000     4 WEEKS     1,000 - 30,000     5 WEEKS     3,500 - 115,000   6-8 WEEKS     12,000 - 270,000    12 WEEKS     15,000 - 220,000        FEMALE AND NON-PREGNANT FEMALE:     LESS THAN 5 mIU/mL     US Ob Transvaginal  11/25/2014   CLINICAL DATA:  Pain along the left side.  No vaginal bleeding.  EXAM: OBSTETRIC <14 WK [place] AND TRANSVAGINAL OB  US DOPPLER ULTRASOUND OF OVARIES  TECHNIQUE: Both transabdominal and transvaginal ultrasound examinations were performed for complete evaluation of the gestation as well as the maternal uterus, adnexal regions, and pelvic cul-de-sac.  Transvaginal technique was performed to assess early pregnancy.  Color and duplex Doppler ultrasound was utilized to evaluate blood flow to the ovaries.  COMPARISON:  None.  FINDINGS: Intrauterine gestational sac: Visualized/normal in shape.  Yolk sac:  Not visualized  Embryo:  Not visualized  Cardiac Activity: Not visualized  MSD: 8.4  mm   5 w   3 d          [place] EDC: 07/25/2015  Maternal uterus/adnexae: The uterus is normal in size and echogenicity. The right ovary is normal in size and echogenicity. The left ovary demonstrates a 5.2 x 6.9 x 7 cm anechoic mass with increased through transmission most consistent with a cyst. There is no pelvic free fluid.  Pulsed Doppler evaluation of both ovaries demonstrates normal appearing low-resistance arterial and venous waveforms.  IMPRESSION: 1. No ovarian torsion. 2. Stable left ovarian cyst. 3. Probable early intrauterine gestational sac, but no yolk sac, fetal pole or cardiac activity yet visualized. Recommend follow-up quantitative B-HCG levels and follow-up US in 14 days to confirm and assess viability. This recommendation follows SRU consensus guidelines: Diagnostic Criteria for Nonviable Pregnancy Early in the First Trimester. [employer] Med 2013; 762:8315-17.   Electronically Signed   By: Kathreen Devoid   On: 11/25/2014 14:18   [place] Art/ven Flow Abd Pelv Doppler Limited  11/25/2014   CLINICAL DATA:  Pain along the left side.  No vaginal bleeding.  EXAM: OBSTETRIC <14 WK [place] AND TRANSVAGINAL OB  US DOPPLER ULTRASOUND OF OVARIES  TECHNIQUE: Both transabdominal and transvaginal ultrasound examinations were performed for complete evaluation of the gestation as well as the maternal uterus, adnexal regions, and pelvic cul-de-sac. Transvaginal technique was performed to assess early pregnancy.  Color and duplex Doppler ultrasound was utilized to evaluate blood flow to the ovaries.  COMPARISON:  None.  FINDINGS: Intrauterine gestational sac: Visualized/normal in shape.  Yolk  sac:  Not visualized  Embryo:  Not visualized  Cardiac Activity: Not visualized  MSD: 8.4  mm   5 w   3 d  [place] EDC: 07/25/2015  Maternal uterus/adnexae: The uterus is normal in size and echogenicity. The right ovary is normal in size and echogenicity. The left ovary demonstrates a 5.2 x 6.9 x 7 cm anechoic mass with increased through transmission most consistent with a cyst. There is no pelvic free fluid.  Pulsed Doppler evaluation of both ovaries demonstrates normal appearing low-resistance arterial and venous waveforms.  IMPRESSION: 1. No ovarian torsion. 2. Stable left ovarian cyst. 3. Probable early intrauterine gestational sac, but no yolk sac, fetal pole or cardiac activity yet visualized. Recommend follow-up quantitative B-HCG levels and follow-up US in 14 days to confirm and assess viability. This recommendation follows SRU consensus guidelines: Diagnostic Criteria for Nonviable Pregnancy Early in the First Trimester. [employer] Med 2013; 099:8338-25.   Electronically Signed   By: Kathreen Devoid   On: 11/25/2014 14:18   Review of Systems  Constitutional: Negative for fever and chills.  Gastrointestinal: Positive for abdominal pain (Mild- lower abdominal cramping ). Negative for nausea and vomiting.  Genitourinary:       Denies vaginal bleeding    Physical Exam   Blood pressure 111/79, pulse 102, temperature 98.6 F (37 C), resp. rate 18, last menstrual period 09/27/2014, not currently breastfeeding.  Physical Exam  Constitutional: She is oriented to person, place, and time. She appears well-developed and well-nourished. No distress.  HENT:  Head: Normocephalic.  Eyes: Pupils are equal, round, and reactive to light.  Neck: Neck supple.  Respiratory: Effort normal.  GI: Soft. She exhibits no distension. There is no tenderness. There is no rebound and no guarding.  Musculoskeletal: Normal range of motion.  Neurological: She is alert and oriented to person, place, and time.  Skin: Skin  is warm. She is not diaphoretic.  Psychiatric: Her behavior is normal.    MAU Course  Procedures  None  MDM  3/28: 713 3/30: 1840 4/4: 9703  Previous US on 3/28 showed no IUGS; todays US shows a normal IUGS.  Appropriate rise in beta hcg levels.  Assessment and Plan   A:  Ovarian cyst; stable Pregnancy of unknown location Appropriate rise in beta hcg levels IUGS without yolk sac    P:  Discharge home in stable condition Return to MAU if symptoms worsen Ectopic precautions.  Repeat US in 7 days here at [facility] hospital per patient request.  Keep next OB appointment as scheduled  Pelvic rest    Lezlie Lye, NP 11/25/2014 3:51 PM

## 2014-11-25 NOTE — MAU Note (Signed)
Pt presents to MAU for follow up blood work and U/S. Mild abdominal cramping, denies any vaginal bleeding

## 2014-11-27 ENCOUNTER — Ambulatory Visit (HOSPITAL_COMMUNITY): Payer: Medicaid Other

## 2014-12-02 ENCOUNTER — Encounter (HOSPITAL_COMMUNITY): Payer: Self-pay | Admitting: Obstetrics and Gynecology

## 2014-12-02 ENCOUNTER — Inpatient Hospital Stay (HOSPITAL_COMMUNITY)
Admission: AD | Admit: 2014-12-02 | Discharge: 2014-12-02 | Disposition: A | Discharge status: Home / Self Care | Payer: Medicaid Other | Source: Ambulatory Visit | Attending: Obstetrics and Gynecology | Admitting: Obstetrics and Gynecology

## 2014-12-02 ENCOUNTER — Ambulatory Visit (HOSPITAL_COMMUNITY)
Admission: RE | Admit: 2014-12-02 | Discharge: 2014-12-02 | Disposition: A | Discharge status: Home / Self Care | Payer: Medicaid Other | Source: Ambulatory Visit | Attending: Obstetrics and Gynecology | Admitting: Obstetrics and Gynecology

## 2014-12-02 DIAGNOSIS — N8320 Unspecified ovarian cysts: Secondary | ICD-10-CM | POA: Diagnosis not present

## 2014-12-02 DIAGNOSIS — N83202 Unspecified ovarian cyst, left side: Secondary | ICD-10-CM

## 2014-12-02 DIAGNOSIS — R102 Pelvic and perineal pain: Secondary | ICD-10-CM | POA: Diagnosis present

## 2014-12-02 DIAGNOSIS — O3680X0 Pregnancy with inconclusive fetal viability, not applicable or unspecified: Secondary | ICD-10-CM

## 2014-12-02 DIAGNOSIS — O43891 Other placental disorders, first trimester: Secondary | ICD-10-CM

## 2014-12-02 DIAGNOSIS — O9989 Other specified diseases and conditions complicating pregnancy, childbirth and the puerperium: Secondary | ICD-10-CM | POA: Insufficient documentation

## 2014-12-02 DIAGNOSIS — R109 Unspecified abdominal pain: Secondary | ICD-10-CM | POA: Diagnosis not present

## 2014-12-02 DIAGNOSIS — O3481 Maternal care for other abnormalities of pelvic organs, first trimester: Secondary | ICD-10-CM | POA: Diagnosis not present

## 2014-12-02 DIAGNOSIS — O208 Other hemorrhage in early pregnancy: Secondary | ICD-10-CM | POA: Insufficient documentation

## 2014-12-02 DIAGNOSIS — Z3A01 Less than 8 weeks gestation of pregnancy: Secondary | ICD-10-CM | POA: Insufficient documentation

## 2014-12-02 DIAGNOSIS — O26891 Other specified pregnancy related conditions, first trimester: Secondary | ICD-10-CM

## 2014-12-02 NOTE — MAU Provider Note (Signed)
Subjective:  Ms. Kim [name] is here today for a follow up US. She is a 29 y.o. female G2P1001 at [redacted]w[redacted]d. She has started her care in [place].    Objective:  GENERAL: Well-developed, well-nourished female in no acute distress.  LUNGS: Effort normal SKIN: Warm, dry and without erythema PSYCH: Normal mood and affect  There were no vitals filed for this visit.  US Ob Transvaginal  12/02/2014   CLINICAL DATA:  Pain with pregnancy  EXAM: TRANSVAGINAL OB ULTRASOUND  TECHNIQUE: Transvaginal ultrasound was performed for complete evaluation of the gestation as well as the maternal uterus, adnexal regions, and pelvic cul-de-sac.  COMPARISON:  11/25/2014.  FINDINGS: Intrauterine gestational sac: Single  Yolk sac:  Present  Embryo:  Present  Cardiac Activity: Present  Heart Rate: 120 bpm  CRL:   2.5  mm   5 w 6 d                  [place] EDC: 07/29/2015  Maternal uterus/adnexae:  Subchorionic hemorrhage: Small subchorionic hemorrhage measuring 2.1 x 1.3 x 1.5 cm  Right ovary: Normal  Left ovary: Large left over in cysts measures 7.1 x 5.8 x 6.4 cm. This is unchanged in size from previous exam.  Other :None  Free fluid:  None  IMPRESSION: 1. Single living intrauterine gestation with an estimated gestational age of [redacted] weeks and 6 days. 2. Small subchorionic hemorrhage. 3. Stable left ovarian cyst.   Electronically Signed   By: Kerby Moors M.D.   On: 12/02/2014 15:40    Assessment:  1. Cyst of left ovary   2. Subchorionic hematoma, first trimester   3.      SIUP @ [redacted]w[redacted]d   Plan:  Discharge home in stable condition Follow up with OB as scheduled First trimester warning signs discussed.   Lezlie Lye, NP 12/02/2014 5:19 PM

## 2014-12-20 ENCOUNTER — Encounter: Payer: Self-pay | Admitting: Family

## 2014-12-20 ENCOUNTER — Ambulatory Visit (INDEPENDENT_AMBULATORY_CARE_PROVIDER_SITE_OTHER): Payer: Medicaid Other | Admitting: Family

## 2014-12-20 ENCOUNTER — Encounter: Payer: Self-pay | Admitting: *Deleted

## 2014-12-20 VITALS — BP 124/75 | HR 73 | Wt 153.0 lb

## 2014-12-20 DIAGNOSIS — O34219 Maternal care for unspecified type scar from previous cesarean delivery: Secondary | ICD-10-CM

## 2014-12-20 DIAGNOSIS — Z3491 Encounter for supervision of normal pregnancy, unspecified, first trimester: Secondary | ICD-10-CM

## 2014-12-20 DIAGNOSIS — Z3490 Encounter for supervision of normal pregnancy, unspecified, unspecified trimester: Secondary | ICD-10-CM | POA: Insufficient documentation

## 2014-12-20 DIAGNOSIS — O3421 Maternal care for scar from previous cesarean delivery: Secondary | ICD-10-CM

## 2014-12-20 NOTE — Progress Notes (Signed)
New OB:  Pt here with no report of bleeding.  Slight cramping.  +FHR on ultrasound today.  Declines genetic testing.  Hx significant for hx of csection secondary to breecg, given consent form to review.  Desires TOLAC.  Last pap December 2015. Prenatal labs today.     Exam   BP 124/75 mmHg  Pulse 73  Wt 153 lb (69.4 kg)  LMP 09/27/2014 Uterine Size: size equals dates  Pelvic Exam:    Perineum: No Hemorrhoids, Normal Perineum   Vulva: normal   Vagina:  normal mucosa, normal discharge, no palpable nodules   pH: Not done   Cervix: no bleeding following Pap, no cervical motion tenderness and no lesions   Adnexa: normal adnexa and no mass, fullness, tenderness   Bony Pelvis: Adequate  System: Breast:  No nipple retraction or dimpling, No nipple discharge or bleeding, No axillary or supraclavicular adenopathy, Normal to palpation without dominant masses   Skin: normal coloration and turgor, no rashes    Neurologic: negative   Extremities: normal strength, tone, and muscle mass   HEENT neck supple with midline trachea and thyroid without masses   Mouth/Teeth mucous membranes moist, pharynx normal without lesions   Neck supple and no masses   Cardiovascular: regular rate and rhythm, no murmurs or gallops   Respiratory:  appears well, vitals normal, no respiratory distress, acyanotic, normal RR, neck free of mass or lymphadenopathy, chest clear, no wheezing, crepitations, rhonchi, normal symmetric air entry   Abdomen: soft, non-tender; bowel sounds normal; no masses,  no organomegaly   Urinary: urethral meatus normal

## 2014-12-20 NOTE — Addendum Note (Signed)
Addended by: Gretchen Short on: 12/20/2014 09:55 AM   Modules accepted: Orders

## 2014-12-20 NOTE — Progress Notes (Signed)
Bedside US shows Single IUP measuring CRL-2.5cm GA-[redacted]w[redacted]d [id number]

## 2014-12-23 LAB — PRENATAL PROFILE (SOLSTAS)
Antibody Screen: NEGATIVE
BASOS ABS: 0 10*3/uL (ref 0.0–0.1)
BASOS PCT: 0 % (ref 0–1)
EOS ABS: 0 10*3/uL (ref 0.0–0.7)
Eosinophils Relative: 0 % (ref 0–5)
HEMATOCRIT: 39 % (ref 36.0–46.0)
HEMOGLOBIN: 13.4 g/dL (ref 12.0–15.0)
HIV: NONREACTIVE
Hepatitis B Surface Ag: NEGATIVE
Lymphocytes Relative: 24 % (ref 12–46)
Lymphs Abs: 2.1 10*3/uL (ref 0.7–4.0)
MCH: 31.5 pg (ref 26.0–34.0)
MCHC: 34.4 g/dL (ref 30.0–36.0)
MCV: 91.5 fL (ref 78.0–100.0)
MPV: 10.8 fL (ref 8.6–12.4)
Monocytes Absolute: 0.5 10*3/uL (ref 0.1–1.0)
Monocytes Relative: 6 % (ref 3–12)
Neutro Abs: 6.2 10*3/uL (ref 1.7–7.7)
Neutrophils Relative %: 70 % (ref 43–77)
PLATELETS: 268 10*3/uL (ref 150–400)
RBC: 4.26 MIL/uL (ref 3.87–5.11)
RDW: 13.6 % (ref 11.5–15.5)
RH TYPE: POSITIVE
Rubella: 2.23 Index — ABNORMAL HIGH (ref <0.90)
WBC: 8.9 10*3/uL (ref 4.0–10.5)

## 2015-01-17 ENCOUNTER — Encounter: Payer: Medicaid Other | Admitting: Advanced Practice Midwife

## 2015-01-24 ENCOUNTER — Ambulatory Visit (INDEPENDENT_AMBULATORY_CARE_PROVIDER_SITE_OTHER): Payer: Medicaid Other | Admitting: Family

## 2015-01-24 ENCOUNTER — Encounter: Payer: Self-pay | Admitting: Family

## 2015-01-24 VITALS — BP 112/73 | HR 109 | Wt 156.0 lb

## 2015-01-24 DIAGNOSIS — Z3491 Encounter for supervision of normal pregnancy, unspecified, first trimester: Secondary | ICD-10-CM

## 2015-01-24 DIAGNOSIS — Z3481 Encounter for supervision of other normal pregnancy, first trimester: Secondary | ICD-10-CM

## 2015-01-24 NOTE — Progress Notes (Signed)
Doing well; no questions or concerns. Nausea is improving.  Reviewed new ob labs - nml.  Schedule anatomy ultrasound in 5 weeks.

## 2015-01-27 ENCOUNTER — Encounter: Payer: Self-pay | Admitting: *Deleted

## 2015-02-21 ENCOUNTER — Encounter: Payer: Self-pay | Admitting: Advanced Practice Midwife

## 2015-02-21 ENCOUNTER — Ambulatory Visit (INDEPENDENT_AMBULATORY_CARE_PROVIDER_SITE_OTHER): Payer: Medicaid Other | Admitting: Advanced Practice Midwife

## 2015-02-21 VITALS — BP 105/70 | HR 84 | Wt 157.0 lb

## 2015-02-21 DIAGNOSIS — O34219 Maternal care for unspecified type scar from previous cesarean delivery: Secondary | ICD-10-CM

## 2015-02-21 DIAGNOSIS — Z3493 Encounter for supervision of normal pregnancy, unspecified, third trimester: Secondary | ICD-10-CM

## 2015-02-21 DIAGNOSIS — O3421 Maternal care for scar from previous cesarean delivery: Secondary | ICD-10-CM

## 2015-02-21 NOTE — Patient Instructions (Signed)
Second Trimester of Pregnancy The second trimester is from week 13 through week 28, months 4 through 6. The second trimester is often a time when you feel your best. Your body has also adjusted to being pregnant, and you begin to feel better physically. Usually, morning sickness has lessened or quit completely, you may have more energy, and you may have an increase in appetite. The second trimester is also a time when the fetus is growing rapidly. At the end of the sixth month, the fetus is about 9 inches long and weighs about 1 pounds. You will likely begin to feel the baby move (quickening) between 18 and 20 weeks of the pregnancy. BODY CHANGES Your body goes through many changes during pregnancy. The changes vary from woman to woman.   Your weight will continue to increase. You will notice your lower abdomen bulging out.  You may begin to get stretch marks on your hips, abdomen, and breasts.  You may develop headaches that can be relieved by medicines approved by your health care provider.  You may urinate more often because the fetus is pressing on your bladder.  You may develop or continue to have heartburn as a result of your pregnancy.  You may develop constipation because certain hormones are causing the muscles that push waste through your intestines to slow down.  You may develop hemorrhoids or swollen, bulging veins (varicose veins).  You may have back pain because of the weight gain and pregnancy hormones relaxing your joints between the bones in your pelvis and as a result of a shift in weight and the muscles that support your balance.  Your breasts will continue to grow and be tender.  Your gums may bleed and may be sensitive to brushing and flossing.  Dark spots or blotches (chloasma, mask of pregnancy) may develop on your face. This will likely fade after the baby is born.  A dark line from your belly button to the pubic area (linea nigra) may appear. This will likely fade  after the baby is born.  You may have changes in your hair. These can include thickening of your hair, rapid growth, and changes in texture. Some women also have hair loss during or after pregnancy, or hair that feels dry or thin. Your hair will most likely return to normal after your baby is born. WHAT TO EXPECT AT YOUR PRENATAL VISITS During a routine prenatal visit:  You will be weighed to make sure you and the fetus are growing normally.  Your blood pressure will be taken.  Your abdomen will be measured to track your baby's growth.  The fetal heartbeat will be listened to.  Any test results from the previous visit will be discussed. Your health care provider may ask you:  How you are feeling.  If you are feeling the baby move.  If you have had any abnormal symptoms, such as leaking fluid, bleeding, severe headaches, or abdominal cramping.  If you have any questions. Other tests that may be performed during your second trimester include:  Blood tests that check for:  Low iron levels (anemia).  Gestational diabetes (between 24 and 28 weeks).  Rh antibodies.  Urine tests to check for infections, diabetes, or protein in the urine.  An ultrasound to confirm the proper growth and development of the baby.  An amniocentesis to check for possible genetic problems.  Fetal screens for spina bifida and Down syndrome. HOME CARE INSTRUCTIONS   Avoid all smoking, herbs, alcohol, and unprescribed   drugs. These chemicals affect the formation and growth of the baby.  Follow your health care provider's instructions regarding medicine use. There are medicines that are either safe or unsafe to take during pregnancy.  Exercise only as directed by your health care provider. Experiencing uterine cramps is a good sign to stop exercising.  Continue to eat regular, healthy meals.  Wear a good support bra for breast tenderness.  Do not use hot tubs, steam rooms, or saunas.  Wear your  seat belt at all times when driving.  Avoid raw meat, uncooked cheese, cat litter boxes, and soil used by cats. These carry germs that can cause birth defects in the baby.  Take your prenatal vitamins.  Try taking a stool softener (if your health care provider approves) if you develop constipation. Eat more high-fiber foods, such as fresh vegetables or fruit and whole grains. Drink plenty of fluids to keep your urine clear or pale yellow.  Take warm sitz baths to soothe any pain or discomfort caused by hemorrhoids. Use hemorrhoid cream if your health care provider approves.  If you develop varicose veins, wear support hose. Elevate your feet for 15 minutes, 3-4 times a day. Limit salt in your diet.  Avoid heavy lifting, wear low heel shoes, and practice good posture.  Rest with your legs elevated if you have leg cramps or low back pain.  Visit your dentist if you have not gone yet during your pregnancy. Use a soft toothbrush to brush your teeth and be gentle when you floss.  A sexual relationship may be continued unless your health care provider directs you otherwise.  Continue to go to all your prenatal visits as directed by your health care provider. SEEK MEDICAL CARE IF:   You have dizziness.  You have mild pelvic cramps, pelvic pressure, or nagging pain in the abdominal area.  You have persistent nausea, vomiting, or diarrhea.  You have a bad smelling vaginal discharge.  You have pain with urination. SEEK IMMEDIATE MEDICAL CARE IF:   You have a fever.  You are leaking fluid from your vagina.  You have spotting or bleeding from your vagina.  You have severe abdominal cramping or pain.  You have rapid weight gain or loss.  You have shortness of breath with chest pain.  You notice sudden or extreme swelling of your face, hands, ankles, feet, or legs.  You have not felt your baby move in over an hour.  You have severe headaches that do not go away with  medicine.  You have vision changes. Document Released: 08/03/2001 Document Revised: 08/14/2013 Document Reviewed: 10/10/2012 [employer] Patient Information 2015 [employer]. This information is not intended to replace advice given to you by your health care provider. Make sure you discuss any questions you have with your health care provider.  

## 2015-02-21 NOTE — Progress Notes (Signed)
Anatomy US scheduled. Addressed questions about IOL recommendations.

## 2015-02-27 ENCOUNTER — Ambulatory Visit (HOSPITAL_COMMUNITY): Admission: RE | Admit: 2015-02-27 | Payer: Medicaid Other | Source: Ambulatory Visit

## 2015-02-27 ENCOUNTER — Ambulatory Visit (HOSPITAL_COMMUNITY)
Admission: RE | Admit: 2015-02-27 | Discharge: 2015-02-27 | Disposition: A | Discharge status: Home / Self Care | Payer: Medicaid Other | Source: Ambulatory Visit | Attending: Family | Admitting: Family

## 2015-02-27 DIAGNOSIS — O3421 Maternal care for scar from previous cesarean delivery: Secondary | ICD-10-CM | POA: Diagnosis not present

## 2015-02-27 DIAGNOSIS — Z3A18 18 weeks gestation of pregnancy: Secondary | ICD-10-CM | POA: Insufficient documentation

## 2015-02-27 DIAGNOSIS — Z36 Encounter for antenatal screening of mother: Secondary | ICD-10-CM | POA: Diagnosis not present

## 2015-02-27 DIAGNOSIS — Z3689 Encounter for other specified antenatal screening: Secondary | ICD-10-CM | POA: Insufficient documentation

## 2015-03-21 ENCOUNTER — Ambulatory Visit (INDEPENDENT_AMBULATORY_CARE_PROVIDER_SITE_OTHER): Payer: Medicaid Other | Admitting: Advanced Practice Midwife

## 2015-03-21 VITALS — BP 107/70 | HR 88 | Wt 160.0 lb

## 2015-03-21 DIAGNOSIS — Z0489 Encounter for examination and observation for other specified reasons: Secondary | ICD-10-CM

## 2015-03-21 DIAGNOSIS — IMO0002 Reserved for concepts with insufficient information to code with codable children: Secondary | ICD-10-CM

## 2015-03-21 DIAGNOSIS — Z3482 Encounter for supervision of other normal pregnancy, second trimester: Secondary | ICD-10-CM

## 2015-03-21 DIAGNOSIS — Z3492 Encounter for supervision of normal pregnancy, unspecified, second trimester: Secondary | ICD-10-CM

## 2015-03-21 NOTE — Patient Instructions (Addendum)
Second Trimester of Pregnancy The second trimester is from week 13 through week 28, months 4 through 6. The second trimester is often a time when you feel your best. Your body has also adjusted to being pregnant, and you begin to feel better physically. Usually, morning sickness has lessened or quit completely, you may have more energy, and you may have an increase in appetite. The second trimester is also a time when the fetus is growing rapidly. At the end of the sixth month, the fetus is about 9 inches long and weighs about 1 pounds. You will likely begin to feel the baby move (quickening) between 18 and 20 weeks of the pregnancy. BODY CHANGES Your body goes through many changes during pregnancy. The changes vary from woman to woman.   Your weight will continue to increase. You will notice your lower abdomen bulging out.  You may begin to get stretch marks on your hips, abdomen, and breasts.  You may develop headaches that can be relieved by medicines approved by your health care provider.  You may urinate more often because the fetus is pressing on your bladder.  You may develop or continue to have heartburn as a result of your pregnancy.  You may develop constipation because certain hormones are causing the muscles that push waste through your intestines to slow down.  You may develop hemorrhoids or swollen, bulging veins (varicose veins).  You may have back pain because of the weight gain and pregnancy hormones relaxing your joints between the bones in your pelvis and as a result of a shift in weight and the muscles that support your balance.  Your breasts will continue to grow and be tender.  Your gums may bleed and may be sensitive to brushing and flossing.  Dark spots or blotches (chloasma, mask of pregnancy) may develop on your face. This will likely fade after the baby is born.  A dark line from your belly button to the pubic area (linea nigra) may appear. This will likely fade  after the baby is born.  You may have changes in your hair. These can include thickening of your hair, rapid growth, and changes in texture. Some women also have hair loss during or after pregnancy, or hair that feels dry or thin. Your hair will most likely return to normal after your baby is born. WHAT TO EXPECT AT YOUR PRENATAL VISITS During a routine prenatal visit:  You will be weighed to make sure you and the fetus are growing normally.  Your blood pressure will be taken.  Your abdomen will be measured to track your baby's growth.  The fetal heartbeat will be listened to.  Any test results from the previous visit will be discussed. Your health care provider may ask you:  How you are feeling.  If you are feeling the baby move.  If you have had any abnormal symptoms, such as leaking fluid, bleeding, severe headaches, or abdominal cramping.  If you have any questions. Other tests that may be performed during your second trimester include:  Blood tests that check for:  Low iron levels (anemia).  Gestational diabetes (between 24 and 28 weeks).  Rh antibodies.  Urine tests to check for infections, diabetes, or protein in the urine.  An ultrasound to confirm the proper growth and development of the baby.  An amniocentesis to check for possible genetic problems.  Fetal screens for spina bifida and Down syndrome. HOME CARE INSTRUCTIONS   Avoid all smoking, herbs, alcohol, and unprescribed   drugs. These chemicals affect the formation and growth of the baby.  Follow your health care provider's instructions regarding medicine use. There are medicines that are either safe or unsafe to take during pregnancy.  Exercise only as directed by your health care provider. Experiencing uterine cramps is a good sign to stop exercising.  Continue to eat regular, healthy meals.  Wear a good support bra for breast tenderness.  Do not use hot tubs, steam rooms, or saunas.  Wear your  seat belt at all times when driving.  Avoid raw meat, uncooked cheese, cat litter boxes, and soil used by cats. These carry germs that can cause birth defects in the baby.  Take your prenatal vitamins.  Try taking a stool softener (if your health care provider approves) if you develop constipation. Eat more high-fiber foods, such as fresh vegetables or fruit and whole grains. Drink plenty of fluids to keep your urine clear or pale yellow.  Take warm sitz baths to soothe any pain or discomfort caused by hemorrhoids. Use hemorrhoid cream if your health care provider approves.  If you develop varicose veins, wear support hose. Elevate your feet for 15 minutes, 3-4 times a day. Limit salt in your diet.  Avoid heavy lifting, wear low heel shoes, and practice good posture.  Rest with your legs elevated if you have leg cramps or low back pain.  Visit your dentist if you have not gone yet during your pregnancy. Use a soft toothbrush to brush your teeth and be gentle when you floss.  A sexual relationship may be continued unless your health care provider directs you otherwise.  Continue to go to all your prenatal visits as directed by your health care provider. SEEK MEDICAL CARE IF:   You have dizziness.  You have mild pelvic cramps, pelvic pressure, or nagging pain in the abdominal area.  You have persistent nausea, vomiting, or diarrhea.  You have a bad smelling vaginal discharge.  You have pain with urination. SEEK IMMEDIATE MEDICAL CARE IF:   You have a fever.  You are leaking fluid from your vagina.  You have spotting or bleeding from your vagina.  You have severe abdominal cramping or pain.  You have rapid weight gain or loss.  You have shortness of breath with chest pain.  You notice sudden or extreme swelling of your face, hands, ankles, feet, or legs.  You have not felt your baby move in over an hour.  You have severe headaches that do not go away with  medicine.  You have vision changes. Document Released: 08/03/2001 Document Revised: 08/14/2013 Document Reviewed: 10/10/2012 [facility] Patient Information 2015 [place], [place]. This information is not intended to replace advice given to you by your health care provider. Make sure you discuss any questions you have with your health care provider.  Breastfeeding Challenges and Solutions Even though breastfeeding is natural, it can be challenging, especially in the first few weeks after childbirth. It is normal for problems to arise when starting to breastfeed your new baby, even if you have breastfed before. This document provides some solutions to the most common breastfeeding challenges.  CHALLENGES AND SOLUTIONS Challenge--Cracked or Sore Nipples Cracked or sore nipples are commonly experienced by breastfeeding mothers. Cracked or sore nipples often are caused by inadequate latching (when your baby's mouth attaches to your breast to breastfeed). Soreness can also happen if your baby is not positioned properly at your breast. Although nipple cracking and soreness are common during the first week after birth,  nipple pain is never normal. If you experience nipple cracking or soreness that lasts longer than 1 week or nipple pain, call your health care provider or lactation consultant.  Solution Ensure proper latching and positioning of your baby by following the steps below:  Find a comfortable place to sit or lie down, with your neck and back well supported.  Place a pillow or rolled up blanket under your baby to bring him or her to the level of your breast (if you are seated).  Make sure that your baby's abdomen is facing your abdomen.  Gently massage your breast. With your fingertips, massage from your chest wall toward your nipple in a circular motion. This encourages milk flow. You may need to continue this action during the feeding if your milk flows slowly.  Support your breast with 4  fingers underneath and your thumb above your nipple. Make sure your fingers are well away from your nipple and your baby's mouth.  Stroke your baby's lips gently with your finger or nipple.  When your baby's mouth is open wide enough, quickly bring your baby to your breast, placing your entire nipple and as much of the colored area around your nipple (areola) as possible into your baby's mouth.  More areola should be visible above your baby's upper lip than below the lower lip.  Your baby's tongue should be between his or her lower gum and your breast.  Ensure that your baby's mouth is correctly positioned around your nipple (latched). Your baby's lips should create a seal on your breast and be turned out (everted).  It is common for your baby to suck for about 2-3 minutes in order to start the flow of breast milk. Signs that your baby has successfully latched on to your nipple include:   Quietly tugging or quietly sucking without causing you pain.   Swallowing heard between every 3-4 sucks.   Muscle movement above and in front of his or her ears with sucking.  Signs that your baby has not successfully latched on to nipple include:   Sucking sounds or smacking sounds from your baby while nursing.   Nipple pain.  Ensure that your breasts stay moisturized and healthy by:  Avoiding the use of soap on your nipples.   Wearing a supportive bra. Avoid wearing underwire-style bras or tight bras.   Air drying your nipples for 3-4 minutes after each feeding.   Using only cotton bra pads to absorb breast milk leakage. Leaking of breast milk between feedings is normal. Be sure to change the pads if they become soaked with milk.  Using lanolin on your nipples after nursing. Lanolin helps to maintain your skin's normal moisture barrier. If you use pure lanolin you do not need to wash it off before feeding your baby again. Pure lanolin is not toxic to your baby. You may also hand  express a few drops of breast milk and gently massage that milk into your nipples, allowing it to air dry. Challenge--Breast Engorgement Breast engorgement is the overfilling of your breasts with breast milk. In the first few weeks after giving birth, you may experience breast engorgement. Breast engorgement can make your breasts throb and feel hard, tightly stretched, warm, and tender. Engorgement peaks about the fifth day after you give birth. Having breast engorgement does not mean you have to stop breastfeeding your baby. Solution  Breastfeed when you feel the need to reduce the fullness of your breasts or when your baby shows signs of  hunger. This is called "breastfeeding on demand."  Newborns (babies younger than 4 weeks) often breastfeed every 1-3 hours during the day. You may need to awaken your baby to feed if he or she is asleep at a feeding time.  Do not allow your baby to sleep longer than 5 hours during the night without a feeding.  Pump or hand express breast milk before breastfeeding to soften your breast, areola, and nipple.  Apply warm, moist heat (in the shower or with warm water-soaked hand towels) just before feeding or pumping, or massage your breast before or during breastfeeding. This increases circulation and helps your milk to flow.  Completely empty your breasts when breastfeeding or pumping. Afterward, wear a snug bra (nursing or regular) or tank top for 1-2 days to signal your body to slightly decrease milk production. Only wear snug bras or tank tops to treat engorgement. Tight bras typically should be avoided by breastfeeding mothers. Once engorgement is relieved, return to wearing regular, loose-fitting clothes.  Apply ice packs to your breasts to lessen the pain from engorgement and relieve swelling, unless the ice is uncomfortable for you.  Do not delay feedings. Try to relax when it is time to feed your baby. This helps to trigger your "let-down reflex," which  releases milk from your breast.  Ensure your baby is latched on to your breast and positioned properly while breastfeeding.  Allow your baby to remain at your breast as long as he or she is latched on well and actively sucking. Your baby will let you know when he or she is done breastfeeding by pulling away from your breast or falling asleep.  Avoid introducing bottles or pacifiers to your baby in the early weeks of breastfeeding. Wait to introduce these things until after resolving any breastfeeding challenges.  Try to pump your milk on the same schedule as when your baby would breastfeed if you are returning to work or away from home for an extended period.  Drink plenty of fluids to avoid dehydration, which can eventually put you at greater risk of breast engorgement. If you follow these suggestions, your engorgement should improve in 24-48 hours. If you are still experiencing difficulty, call your lactation consultant or health care provider.  Challenge--Plugged Milk Ducts Plugged milk ducts occur when the duct does not drain milk effectively and becomes swollen. Wearing a tight-fitting nursing bra or having difficulty with latching may cause plugged milk ducts. Not drinking enough water (8-10 c [1.9-2.4 L] per day) can contribute to plugged milk ducts. Once a duct has become plugged, hard lumps, soreness, and redness may develop in your breast.  Solution Do not delay feedings. Feed your baby frequently and try to empty your breasts of milk at each feeding. Try breastfeeding from the affected side first so there is a better chance that the milk will drain completely from that breast. Apply warm, moist towels to your breasts for 5-10 minutes before feeding. Alternatively, a hot shower right before breastfeeding can provide the moist heat that can encourage milk flow. Gentle massage of the sore area before and during a feeding may also help. Avoid wearing tight clothing or bras that put pressure on  your breasts. Wear bras that offer good support to your breasts, but avoid underwire bras. If you have a plugged milk duct and develop a fever, you need to see your health care provider.  Challenge--Mastitis Mastitis is inflammation of your breast. It usually is caused by a bacterial infection and can  cause flu-like symptoms. You may develop redness in your breast and a fever. Often when mastitis occurs, your breast becomes firm, warm, and very painful. The most common causes of mastitis are poor latching, ineffective sucking from your baby, consistent pressure on your breast (possibly from wearing a tight-fitting bra or shirt that restricts the milk flow), unusual stress or fatigue, or missed feedings.  Solution You will be given antibiotic medicine to treat the infection. It is still important to breastfeed frequently to empty your breasts. Continuing to breastfeed while you recover from mastitis will not harm your baby. Make sure your baby is positioned properly during every feeding. Apply moist heat to your breasts for a few minutes before feeding to help the milk flow and to help your breasts empty more easily. Challenge--Thrush Ritta Slot is a yeast infection that can form on your nipples, in your breast, or in your baby's mouth. It causes itching, soreness, burning or stabbing pain, and sometimes a rash.  Solution You will be given a medicated ointment for your nipples, and your baby will be given a liquid medicine for his or her mouth. It is important that you and your baby are treated at the same time because thrush can be passed between you and your baby. Change disposable nursing pads often. Any bras, towels, or clothing that come in contact with infected areas of your body or your baby's body need to be washed in very hot water every day. Wash your hands and your baby's hands often. All pacifiers, bottle nipples, or toys your baby puts in his or her mouth should be boiled once a day for 20 minutes.  After 1 week of treatment, discard pacifiers and bottle nipples and buy new ones. All breast pump parts that touch the milk need to be boiled for 20 minutes every day. Challenge--Low Milk Supply You may not be producing enough milk if your baby is not gaining the proper amount of weight. Breast milk production is based on a supply-and-demand system. Your milk supply depends on how frequently and effectively your baby empties your breast. Solution The more you breastfeed and pump, the more breast milk you will produce. It is important that your baby empties at least one of your breasts at each feeding. If this is not happening, then use a breast pump or hand express any milk that remains. This will help to drain as much milk as possible at each feeding. It will also signal your body to produce more milk. If your baby is not emptying your breasts, it may be due to latching, sucking, or positioning problems. If low milk supply continues after addressing these issues, contact your health care provider or a lactation specialist as soon as possible. Challenge--Inverted or Flat Nipples Some women have nipples that turn inward instead of protruding outward. Other women have nipples that are flat. Inverted or flat nipples can sometimes make it more difficult for your baby to latch onto your breast. Solution You may be given a small device that pulls out inverted nipples. This device should be applied right before your baby is brought to your breast. You can also try using a breast pump for a short time before placing the baby at your breast. The pump can pull your nipple outwards to help your infant latch more easily. The baby's sucking motion will help the inverted nipple protrude as well.  If you have flat nipples, encourage your baby to latch onto your breast and feed frequently in the early  days after birth. This will give your baby practice latching on correctly while your breast is still soft. When your milk  supply increases, between the second and fifth day after birth and your breasts become full, your baby will have an easier time latching.  Contact a lactation consultant if you still have concerns. She or he can teach you additional techniques to address breastfeeding problems related to nipple shape and position.  FOR MORE INFORMATION [employer]: www.llli.org Document Released: 01/31/2006 Document Revised: 08/14/2013 Document Reviewed: 02/02/2013 [facility] Patient Information 2015 [place], [place]. This information is not intended to replace advice given to you by your health care provider. Make sure you discuss any questions you have with your health care provider. Breastfeeding Challenges and Solutions Even though breastfeeding is natural, it can be challenging, especially in the first few weeks after childbirth. It is normal for problems to arise when starting to breastfeed your new baby, even if you have breastfed before. This document provides some solutions to the most common breastfeeding challenges.  CHALLENGES AND SOLUTIONS Challenge--Cracked or Sore Nipples Cracked or sore nipples are commonly experienced by breastfeeding mothers. Cracked or sore nipples often are caused by inadequate latching (when your baby's mouth attaches to your breast to breastfeed). Soreness can also happen if your baby is not positioned properly at your breast. Although nipple cracking and soreness are common during the first week after birth, nipple pain is never normal. If you experience nipple cracking or soreness that lasts longer than 1 week or nipple pain, call your health care provider or lactation consultant.  Solution Ensure proper latching and positioning of your baby by following the steps below:  Find a comfortable place to sit or lie down, with your neck and back well supported.  Place a pillow or rolled up blanket under your baby to bring him or her to the level of your breast (if you  are seated).  Make sure that your baby's abdomen is facing your abdomen.  Gently massage your breast. With your fingertips, massage from your chest wall toward your nipple in a circular motion. This encourages milk flow. You may need to continue this action during the feeding if your milk flows slowly.  Support your breast with 4 fingers underneath and your thumb above your nipple. Make sure your fingers are well away from your nipple and your baby's mouth.  Stroke your baby's lips gently with your finger or nipple.  When your baby's mouth is open wide enough, quickly bring your baby to your breast, placing your entire nipple and as much of the colored area around your nipple (areola) as possible into your baby's mouth.  More areola should be visible above your baby's upper lip than below the lower lip.  Your baby's tongue should be between his or her lower gum and your breast.  Ensure that your baby's mouth is correctly positioned around your nipple (latched). Your baby's lips should create a seal on your breast and be turned out (everted).  It is common for your baby to suck for about 2-3 minutes in order to start the flow of breast milk. Signs that your baby has successfully latched on to your nipple include:   Quietly tugging or quietly sucking without causing you pain.   Swallowing heard between every 3-4 sucks.   Muscle movement above and in front of his or her ears with sucking.  Signs that your baby has not successfully latched on to nipple include:   Sucking  sounds or smacking sounds from your baby while nursing.   Nipple pain.  Ensure that your breasts stay moisturized and healthy by:  Avoiding the use of soap on your nipples.   Wearing a supportive bra. Avoid wearing underwire-style bras or tight bras.   Air drying your nipples for 3-4 minutes after each feeding.   Using only cotton bra pads to absorb breast milk leakage. Leaking of breast milk between  feedings is normal. Be sure to change the pads if they become soaked with milk.  Using lanolin on your nipples after nursing. Lanolin helps to maintain your skin's normal moisture barrier. If you use pure lanolin you do not need to wash it off before feeding your baby again. Pure lanolin is not toxic to your baby. You may also hand express a few drops of breast milk and gently massage that milk into your nipples, allowing it to air dry. Challenge--Breast Engorgement Breast engorgement is the overfilling of your breasts with breast milk. In the first few weeks after giving birth, you may experience breast engorgement. Breast engorgement can make your breasts throb and feel hard, tightly stretched, warm, and tender. Engorgement peaks about the fifth day after you give birth. Having breast engorgement does not mean you have to stop breastfeeding your baby. Solution  Breastfeed when you feel the need to reduce the fullness of your breasts or when your baby shows signs of hunger. This is called "breastfeeding on demand."  Newborns (babies younger than 4 weeks) often breastfeed every 1-3 hours during the day. You may need to awaken your baby to feed if he or she is asleep at a feeding time.  Do not allow your baby to sleep longer than 5 hours during the night without a feeding.  Pump or hand express breast milk before breastfeeding to soften your breast, areola, and nipple.  Apply warm, moist heat (in the shower or with warm water-soaked hand towels) just before feeding or pumping, or massage your breast before or during breastfeeding. This increases circulation and helps your milk to flow.  Completely empty your breasts when breastfeeding or pumping. Afterward, wear a snug bra (nursing or regular) or tank top for 1-2 days to signal your body to slightly decrease milk production. Only wear snug bras or tank tops to treat engorgement. Tight bras typically should be avoided by breastfeeding mothers. Once  engorgement is relieved, return to wearing regular, loose-fitting clothes.  Apply ice packs to your breasts to lessen the pain from engorgement and relieve swelling, unless the ice is uncomfortable for you.  Do not delay feedings. Try to relax when it is time to feed your baby. This helps to trigger your "let-down reflex," which releases milk from your breast.  Ensure your baby is latched on to your breast and positioned properly while breastfeeding.  Allow your baby to remain at your breast as long as he or she is latched on well and actively sucking. Your baby will let you know when he or she is done breastfeeding by pulling away from your breast or falling asleep.  Avoid introducing bottles or pacifiers to your baby in the early weeks of breastfeeding. Wait to introduce these things until after resolving any breastfeeding challenges.  Try to pump your milk on the same schedule as when your baby would breastfeed if you are returning to work or away from home for an extended period.  Drink plenty of fluids to avoid dehydration, which can eventually put you at greater risk of  breast engorgement. If you follow these suggestions, your engorgement should improve in 24-48 hours. If you are still experiencing difficulty, call your lactation consultant or health care provider.  Challenge--Plugged Milk Ducts Plugged milk ducts occur when the duct does not drain milk effectively and becomes swollen. Wearing a tight-fitting nursing bra or having difficulty with latching may cause plugged milk ducts. Not drinking enough water (8-10 c [1.9-2.4 L] per day) can contribute to plugged milk ducts. Once a duct has become plugged, hard lumps, soreness, and redness may develop in your breast.  Solution Do not delay feedings. Feed your baby frequently and try to empty your breasts of milk at each feeding. Try breastfeeding from the affected side first so there is a better chance that the milk will drain completely  from that breast. Apply warm, moist towels to your breasts for 5-10 minutes before feeding. Alternatively, a hot shower right before breastfeeding can provide the moist heat that can encourage milk flow. Gentle massage of the sore area before and during a feeding may also help. Avoid wearing tight clothing or bras that put pressure on your breasts. Wear bras that offer good support to your breasts, but avoid underwire bras. If you have a plugged milk duct and develop a fever, you need to see your health care provider.  Challenge--Mastitis Mastitis is inflammation of your breast. It usually is caused by a bacterial infection and can cause flu-like symptoms. You may develop redness in your breast and a fever. Often when mastitis occurs, your breast becomes firm, warm, and very painful. The most common causes of mastitis are poor latching, ineffective sucking from your baby, consistent pressure on your breast (possibly from wearing a tight-fitting bra or shirt that restricts the milk flow), unusual stress or fatigue, or missed feedings.  Solution You will be given antibiotic medicine to treat the infection. It is still important to breastfeed frequently to empty your breasts. Continuing to breastfeed while you recover from mastitis will not harm your baby. Make sure your baby is positioned properly during every feeding. Apply moist heat to your breasts for a few minutes before feeding to help the milk flow and to help your breasts empty more easily. Challenge--Thrush Ritta Slot is a yeast infection that can form on your nipples, in your breast, or in your baby's mouth. It causes itching, soreness, burning or stabbing pain, and sometimes a rash.  Solution You will be given a medicated ointment for your nipples, and your baby will be given a liquid medicine for his or her mouth. It is important that you and your baby are treated at the same time because thrush can be passed between you and your baby. Change  disposable nursing pads often. Any bras, towels, or clothing that come in contact with infected areas of your body or your baby's body need to be washed in very hot water every day. Wash your hands and your baby's hands often. All pacifiers, bottle nipples, or toys your baby puts in his or her mouth should be boiled once a day for 20 minutes. After 1 week of treatment, discard pacifiers and bottle nipples and buy new ones. All breast pump parts that touch the milk need to be boiled for 20 minutes every day. Challenge--Low Milk Supply You may not be producing enough milk if your baby is not gaining the proper amount of weight. Breast milk production is based on a supply-and-demand system. Your milk supply depends on how frequently and effectively your baby empties your  breast. Solution The more you breastfeed and pump, the more breast milk you will produce. It is important that your baby empties at least one of your breasts at each feeding. If this is not happening, then use a breast pump or hand express any milk that remains. This will help to drain as much milk as possible at each feeding. It will also signal your body to produce more milk. If your baby is not emptying your breasts, it may be due to latching, sucking, or positioning problems. If low milk supply continues after addressing these issues, contact your health care provider or a lactation specialist as soon as possible. Challenge--Inverted or Flat Nipples Some women have nipples that turn inward instead of protruding outward. Other women have nipples that are flat. Inverted or flat nipples can sometimes make it more difficult for your baby to latch onto your breast. Solution You may be given a small device that pulls out inverted nipples. This device should be applied right before your baby is brought to your breast. You can also try using a breast pump for a short time before placing the baby at your breast. The pump can pull your nipple  outwards to help your infant latch more easily. The baby's sucking motion will help the inverted nipple protrude as well.  If you have flat nipples, encourage your baby to latch onto your breast and feed frequently in the early days after birth. This will give your baby practice latching on correctly while your breast is still soft. When your milk supply increases, between the second and fifth day after birth and your breasts become full, your baby will have an easier time latching.  Contact a lactation consultant if you still have concerns. She or he can teach you additional techniques to address breastfeeding problems related to nipple shape and position.  FOR MORE INFORMATION [employer]: www.llli.org Document Released: 01/31/2006 Document Revised: 08/14/2013 Document Reviewed: 02/02/2013 [facility] Patient Information 2015 [place], [place]. This information is not intended to replace advice given to you by your health care provider. Make sure you discuss any questions you have with your health care provider.

## 2015-03-21 NOTE — Progress Notes (Signed)
Incomplete anatomy. F/U ordered. BF problems last baby. Wants antepartum LC. Number given.

## 2015-04-02 ENCOUNTER — Inpatient Hospital Stay (HOSPITAL_COMMUNITY)
Admission: AD | Admit: 2015-04-02 | Discharge: 2015-04-03 | Disposition: A | Discharge status: Home / Self Care | Payer: Medicaid Other | Source: Ambulatory Visit | Attending: Obstetrics & Gynecology | Admitting: Obstetrics & Gynecology

## 2015-04-02 ENCOUNTER — Encounter (HOSPITAL_COMMUNITY): Payer: Self-pay | Admitting: *Deleted

## 2015-04-02 DIAGNOSIS — O26899 Other specified pregnancy related conditions, unspecified trimester: Secondary | ICD-10-CM

## 2015-04-02 DIAGNOSIS — O9089 Other complications of the puerperium, not elsewhere classified: Secondary | ICD-10-CM | POA: Diagnosis not present

## 2015-04-02 DIAGNOSIS — Z3492 Encounter for supervision of normal pregnancy, unspecified, second trimester: Secondary | ICD-10-CM

## 2015-04-02 DIAGNOSIS — Z880 Allergy status to penicillin: Secondary | ICD-10-CM | POA: Diagnosis not present

## 2015-04-02 DIAGNOSIS — O9989 Other specified diseases and conditions complicating pregnancy, childbirth and the puerperium: Secondary | ICD-10-CM | POA: Diagnosis not present

## 2015-04-02 DIAGNOSIS — R1030 Lower abdominal pain, unspecified: Secondary | ICD-10-CM | POA: Diagnosis not present

## 2015-04-02 DIAGNOSIS — O34219 Maternal care for unspecified type scar from previous cesarean delivery: Secondary | ICD-10-CM

## 2015-04-02 DIAGNOSIS — R109 Unspecified abdominal pain: Secondary | ICD-10-CM

## 2015-04-02 DIAGNOSIS — N898 Other specified noninflammatory disorders of vagina: Secondary | ICD-10-CM | POA: Diagnosis not present

## 2015-04-02 LAB — URINALYSIS, ROUTINE W REFLEX MICROSCOPIC
Bilirubin Urine: NEGATIVE
Glucose, UA: NEGATIVE mg/dL
HGB URINE DIPSTICK: NEGATIVE
KETONES UR: NEGATIVE mg/dL
Leukocytes, UA: NEGATIVE
Nitrite: NEGATIVE
PROTEIN: NEGATIVE mg/dL
Specific Gravity, Urine: 1.01 (ref 1.005–1.030)
UROBILINOGEN UA: 0.2 mg/dL (ref 0.0–1.0)
pH: 6.5 (ref 5.0–8.0)

## 2015-04-02 NOTE — MAU Note (Addendum)
PT  SAYS SHE STARTED HAVING  LOWER  ABD  PAIN-  HURTS  WHERE C/S   SCAR IS LOCATED  AND   FEELS  TIGHTENING  X6  / DAY   .-  ALL  STARTED  ON Monday.   HAS MORE THAN NL  VAG D/C- STARTED ON  Tuesday- HAS ENOUGH  TO LEAK ON [place] .   [place]  [facility]  [facility].   DENIES HSV AND MRSA .     LAST  SEX- 1 MTH  AGO

## 2015-04-03 DIAGNOSIS — R109 Unspecified abdominal pain: Secondary | ICD-10-CM

## 2015-04-03 DIAGNOSIS — O9989 Other specified diseases and conditions complicating pregnancy, childbirth and the puerperium: Secondary | ICD-10-CM

## 2015-04-03 LAB — WET PREP, GENITAL
CLUE CELLS WET PREP: NONE SEEN
TRICH WET PREP: NONE SEEN
WBC, Wet Prep HPF POC: NONE SEEN
Yeast Wet Prep HPF POC: NONE SEEN

## 2015-04-03 LAB — GC/CHLAMYDIA PROBE AMP (~~LOC~~) NOT AT ARMC
CHLAMYDIA, DNA PROBE: NEGATIVE
Neisseria Gonorrhea: NEGATIVE

## 2015-04-03 NOTE — MAU Provider Note (Signed)
History     CSN: [id number]  Arrival date and time: 04/02/15 2231   First Provider Initiated Contact with Patient 04/02/15 2355      No chief complaint on file.  HPI Comments: Pt comes to MAU w/ complaints of pain along prior c-sec incision over the last few weeks. This has gotten worse over time and is bothersome throughout the day. She states that she is not having any sig contx. She also states that she has had some vag d/c over the last 2 days which has been thin and clear/whitish. It has not had any odor and pt states that she is unsure if it is urine or d/c. She has not had any large LOF or bleeding. She denies dysuria.   OB History    Gravida Para Term Preterm AB TAB SAB Ectopic Multiple Living   2 1 1       1       Obstetric Comments   [name]      Past Medical History  Diagnosis Date  . Fibroid     1.9cm  . 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 Anxiety   . GERD (gastroesophageal reflux disease)   . Vaginal Pap smear, abnormal   . HPV (human papilloma virus) infection     Past Surgical History  Procedure Laterality Date  . No past surgeries    . Cesarean section N/A 06/07/2013    Procedure: PRIMARY CESAREAN SECTION;  Surgeon: Shelly Bombard, MD;  Location: [facility];  Service: Obstetrics;  Laterality: N/A;    Family History  Problem Relation Age of Onset  . Diabetes Father   . Diabetes Maternal Grandmother     Social History  Substance Use Topics  . Smoking status: Never Smoker   . Smokeless tobacco: Never Used  . Alcohol Use: 0.0 oz/week    0 Standard drinks or equivalent per week     Comment: rare  none  while  preg    Allergies:  Allergies  Allergen Reactions  . Naproxen Anaphylaxis    Can take ibuprofen  . Penicillins Other (See Comments)    Family history of allergy, patient prefers to not take this medication    Prescriptions prior to admission  Medication Sig Dispense Refill Last Dose  . folic acid (FOLVITE) 062 MCG tablet Take 800 mcg  by mouth every other day.    04/02/2015 at Unknown time  . Prenatal MV-Min-Fe Fum-FA-DHA (PRENATAL 1 PO) Take by mouth.   04/02/2015 at Unknown time    Review of Systems  Respiratory: Negative for shortness of breath.   Cardiovascular: Negative for chest pain and leg swelling.  Gastrointestinal: Negative for nausea, vomiting and constipation.  Genitourinary: Negative for urgency and frequency.  Neurological: Negative for weakness and headaches.   Physical Exam   Blood pressure 106/59, pulse 69, temperature 98.5 F (36.9 C), temperature source Oral, resp. rate 20, height 5\' 5"  (1.651 m), weight 74.617 kg (164 lb 8 oz), last menstrual period 09/27/2014, unknown if currently breastfeeding.  Physical Exam  Constitutional: She is oriented to person, place, and time. She appears well-developed and well-nourished.  HENT:  Head: Normocephalic and atraumatic.  Eyes: Conjunctivae and EOM are normal.  Neck: Normal range of motion. No tracheal deviation present.  Cardiovascular: Intact distal pulses.   Respiratory: Effort normal. No respiratory distress.  Genitourinary: Vagina normal and uterus normal.  Cervix is visualized and found to have mucus exiting the os; there is no fluid coming through os and  no pooling; small amount of white d/c noted in the fornix; no odor noted.  Musculoskeletal: Normal range of motion.  Neurological: She is alert and oriented to person, place, and time.  Skin: Skin is warm and dry.  Psychiatric: She has a normal mood and affect. Her behavior is normal. Judgment and thought content normal.  Dilation:  (EXTERNAL  1 CM,  INTERNAL - CLOSED) Exam by:: DR ALLEN FHR 150s; mod var; +acels; -decels; no contx  MAU Course  Procedures  MDM Spec exam  Assessment and Plan  Pt is a 29 yo f G2P1001 at [redacted]w[redacted]d who presents to MAU w/ complaints of vag d/c x 2 days and ongoing, worsening lower abdominal pain over her prior c-sec incision.  Lower abd pain #likely due to uterine  expansion causing tension on the scar from the prior c-sec #pt is tolerating this mild pain w/o sig problem #discussed use of a maternity belt #UA obtained and neg  Vag d/c #Sterile spec exam completed w/o signs for PROM; fern neg #d/c that is present may be physiologic vs. Yeast infx; wet prep sent #Wet prep neg #d/c home w/ labor precautions  Reyes Ivan 04/03/2015, 12:14 AM   CNM attestation:  I have seen and examined this patient; I agree with above documentation in the resident's note.   Adeleine Pask is a 29 y.o. G2P1001 reporting discomfort in low abd +FM, denies LOF, VB, contractions, vaginal discharge.  PE: BP 110/73 mmHg  Pulse 75  Temp(Src) 98.5 F (36.9 C) (Oral)  Resp 20  Ht 5\' 5"  (1.651 m)  Wt 74.617 kg (164 lb 8 oz)  BMI 27.37 kg/m2  LMP 09/27/2014 Gen: calm comfortable, NAD Resp: normal effort, no distress Abd: gravid  ROS, labs, PMH reviewed NST reactive 150s, no ctx  Plan: - preterm labor precautions - mat support belt for comfort - continue routine follow up in OB clinic  Serita Grammes, CNM 12:42 AM  04/03/2015

## 2015-04-03 NOTE — Discharge Instructions (Signed)

## 2015-04-04 ENCOUNTER — Other Ambulatory Visit: Payer: Self-pay | Admitting: Advanced Practice Midwife

## 2015-04-04 ENCOUNTER — Ambulatory Visit (HOSPITAL_COMMUNITY)
Admission: RE | Admit: 2015-04-04 | Discharge: 2015-04-04 | Disposition: A | Discharge status: Home / Self Care | Payer: Medicaid Other | Source: Ambulatory Visit | Attending: Advanced Practice Midwife | Admitting: Advanced Practice Midwife

## 2015-04-04 DIAGNOSIS — Z0489 Encounter for examination and observation for other specified reasons: Secondary | ICD-10-CM

## 2015-04-04 DIAGNOSIS — IMO0002 Reserved for concepts with insufficient information to code with codable children: Secondary | ICD-10-CM

## 2015-04-04 DIAGNOSIS — Z3492 Encounter for supervision of normal pregnancy, unspecified, second trimester: Secondary | ICD-10-CM

## 2015-04-04 DIAGNOSIS — Z3A00 Weeks of gestation of pregnancy not specified: Secondary | ICD-10-CM | POA: Insufficient documentation

## 2015-04-04 DIAGNOSIS — Z36 Encounter for antenatal screening of mother: Secondary | ICD-10-CM | POA: Insufficient documentation

## 2015-04-04 DIAGNOSIS — Z3A23 23 weeks gestation of pregnancy: Secondary | ICD-10-CM

## 2015-04-18 ENCOUNTER — Ambulatory Visit (INDEPENDENT_AMBULATORY_CARE_PROVIDER_SITE_OTHER): Payer: Medicaid Other | Admitting: Advanced Practice Midwife

## 2015-04-18 ENCOUNTER — Encounter: Payer: Self-pay | Admitting: Advanced Practice Midwife

## 2015-04-18 VITALS — BP 113/74 | HR 110 | Wt 164.0 lb

## 2015-04-18 DIAGNOSIS — Z3492 Encounter for supervision of normal pregnancy, unspecified, second trimester: Secondary | ICD-10-CM

## 2015-04-18 DIAGNOSIS — Z3482 Encounter for supervision of other normal pregnancy, second trimester: Secondary | ICD-10-CM

## 2015-04-18 NOTE — Patient Instructions (Signed)
Vaginal Delivery During delivery, your health care provider will help you give birth to your baby. During a vaginal delivery, you will work to push the baby out of your vagina. However, before you can push your baby out, a few things need to happen. The opening of your uterus (cervix) has to soften, thin out, and open up (dilate) all the way to 10 cm. Also, your baby has to move down from the uterus into your vagina.  SIGNS OF LABOR  Your health care provider will first need to make sure you are in labor. Signs of labor include:   Passing what is called the mucous plug before labor begins. This is a small amount of blood-stained mucus.  Having regular, painful uterine contractions.   The time between contractions gets shorter.   The discomfort and pain gradually get more intense.  Contraction pains get worse when walking and do not go away when resting.   Your cervix becomes thinner (effacement) and dilates. BEFORE THE DELIVERY Once you are in labor and admitted into the hospital or care center, your health care provider may do the following:   Perform a complete physical exam.  Review any complications related to pregnancy or labor.  Check your blood pressure, pulse, temperature, and heart rate (vital signs).   Determine if, and when, the rupture of amniotic membranes occurred.  Do a vaginal exam (using a sterile glove and lubricant) to determine:   The position (presentation) of the baby. Is the baby's head presenting first (vertex) in the birth canal (vagina), or are the feet or buttocks first (breech)?   The level (station) of the baby's head within the birth canal.   The effacement and dilatation of the cervix.   An electronic fetal monitor is usually placed on your abdomen when you first arrive. This is used to monitor your contractions and the baby's heart rate.  When the monitor is on your abdomen (external fetal monitor), it can only pick up the frequency and  length of your contractions. It cannot tell the strength of your contractions.  If it becomes necessary for your health care provider to know exactly how strong your contractions are or to see exactly what the baby's heart rate is doing, an internal monitor may be inserted into your vagina and uterus. Your health care provider will discuss the benefits and risks of using an internal monitor and obtain your permission before inserting the device.  Continuous fetal monitoring may be needed if you have an epidural, are receiving certain medicines (such as oxytocin), or have pregnancy or labor complications.  An IV access tube may be placed into a vein in your arm to deliver fluids and medicines if necessary. THREE STAGES OF LABOR AND DELIVERY Normal labor and delivery is divided into three stages. First Stage This stage starts when you begin to contract regularly and your cervix begins to efface and dilate. It ends when your cervix is completely open (fully dilated). The first stage is the longest stage of labor and can last from 3 hours to 15 hours.  Several methods are available to help with labor pain. You and your health care provider will decide which option is best for you. Options include:   Opioid medicines. These are strong pain medicines that you can get through your IV tube or as a shot into your muscle. These medicines lessen pain but do not make it go away completely.  Epidural. A medicine is given through a thin tube that   is inserted in your back. The medicine numbs the lower part of your body and prevents any pain in that area.  Paracervical pain medicine. This is an injection of an anesthetic on each side of your cervix.   You may request natural childbirth, which does not involve the use of pain medicines or an epidural during labor and delivery. Instead, you will use other things, such as breathing exercises, to help cope with the pain. Second Stage The second stage of labor  begins when your cervix is fully dilated at 10 cm. It continues until you push your baby down through the birth canal and the baby is born. This stage can take only minutes or several hours.  The location of your baby's head as it moves through the birth canal is reported as a number called a station. If the baby's head has not started its descent, the station is described as being at minus 3 (-3). When your baby's head is at the zero station, it is at the middle of the birth canal and is engaged in the pelvis. The station of your baby helps indicate the progress of the second stage of labor.  When your baby is born, your health care provider may hold the baby with his or her head lowered to prevent amniotic fluid, mucus, and blood from getting into the baby's lungs. The baby's mouth and nose may be suctioned with a small bulb syringe to remove any additional fluid.  Your health care provider may then place the baby on your stomach. It is important to keep the baby from getting cold. To do this, the health care provider will dry the baby off, place the baby directly on your skin (with no blankets between you and the baby), and cover the baby with warm, dry blankets.   The umbilical cord is cut. Third Stage During the third stage of labor, your health care provider will deliver the placenta (afterbirth) and make sure your bleeding is under control. The delivery of the placenta usually takes about 5 minutes but can take up to 30 minutes. After the placenta is delivered, a medicine may be given either by IV or injection to help contract the uterus and control bleeding. If you are planning to breastfeed, you can try to do so now. After you deliver the placenta, your uterus should contract and get very firm. If your uterus does not remain firm, your health care provider will massage it. This is important because the contraction of the uterus helps cut off bleeding at the site where the placenta was attached  to your uterus. If your uterus does not contract properly and stay firm, you may continue to bleed heavily. If there is a lot of bleeding, medicines may be given to contract the uterus and stop the bleeding.  Document Released: 05/18/2008 Document Revised: 12/24/2013 Document Reviewed: 01/28/2013 [facility] Patient Information 2015 [place], [place]. This information is not intended to replace advice given to you by your health care provider. Make sure you discuss any questions you have with your health care provider. Second Trimester of Pregnancy The second trimester is from week 13 through week 28, months 4 through 6. The second trimester is often a time when you feel your best. Your body has also adjusted to being pregnant, and you begin to feel better physically. Usually, morning sickness has lessened or quit completely, you may have more energy, and you may have an increase in appetite. The second trimester is also a time  when the fetus is growing rapidly. At the end of the sixth month, the fetus is about 9 inches long and weighs about 1 pounds. You will likely begin to feel the baby move (quickening) between 18 and 20 weeks of the pregnancy. BODY CHANGES Your body goes through many changes during pregnancy. The changes vary from woman to woman.   Your weight will continue to increase. You will notice your lower abdomen bulging out.  You may begin to get stretch marks on your hips, abdomen, and breasts.  You may develop headaches that can be relieved by medicines approved by your health care provider.  You may urinate more often because the fetus is pressing on your bladder.  You may develop or continue to have heartburn as a result of your pregnancy.  You may develop constipation because certain hormones are causing the muscles that push waste through your intestines to slow down.  You may develop hemorrhoids or swollen, bulging veins (varicose veins).  You may have back pain because of the  weight gain and pregnancy hormones relaxing your joints between the bones in your pelvis and as a result of a shift in weight and the muscles that support your balance.  Your breasts will continue to grow and be tender.  Your gums may bleed and may be sensitive to brushing and flossing.  Dark spots or blotches (chloasma, mask of pregnancy) may develop on your face. This will likely fade after the baby is born.  A dark line from your belly button to the pubic area (linea nigra) may appear. This will likely fade after the baby is born.  You may have changes in your hair. These can include thickening of your hair, rapid growth, and changes in texture. Some women also have hair loss during or after pregnancy, or hair that feels dry or thin. Your hair will most likely return to normal after your baby is born. WHAT TO EXPECT AT YOUR PRENATAL VISITS During a routine prenatal visit:  You will be weighed to make sure you and the fetus are growing normally.  Your blood pressure will be taken.  Your abdomen will be measured to track your baby's growth.  The fetal heartbeat will be listened to.  Any test results from the previous visit will be discussed. Your health care provider may ask you:  How you are feeling.  If you are feeling the baby move.  If you have had any abnormal symptoms, such as leaking fluid, bleeding, severe headaches, or abdominal cramping.  If you have any questions. Other tests that may be performed during your second trimester include:  Blood tests that check for:  Low iron levels (anemia).  Gestational diabetes (between 24 and 28 weeks).  Rh antibodies.  Urine tests to check for infections, diabetes, or protein in the urine.  An ultrasound to confirm the proper growth and development of the baby.  An amniocentesis to check for possible genetic problems.  Fetal screens for spina bifida and Down syndrome. HOME CARE INSTRUCTIONS   Avoid all smoking, herbs,  alcohol, and unprescribed drugs. These chemicals affect the formation and growth of the baby.  Follow your health care provider's instructions regarding medicine use. There are medicines that are either safe or unsafe to take during pregnancy.  Exercise only as directed by your health care provider. Experiencing uterine cramps is a good sign to stop exercising.  Continue to eat regular, healthy meals.  Wear a good support bra for breast tenderness.  Do  not use hot tubs, steam rooms, or saunas.  Wear your seat belt at all times when driving.  Avoid raw meat, uncooked cheese, cat litter boxes, and soil used by cats. These carry germs that can cause birth defects in the baby.  Take your prenatal vitamins.  Try taking a stool softener (if your health care provider approves) if you develop constipation. Eat more high-fiber foods, such as fresh vegetables or fruit and whole grains. Drink plenty of fluids to keep your urine clear or pale yellow.  Take warm sitz baths to soothe any pain or discomfort caused by hemorrhoids. Use hemorrhoid cream if your health care provider approves.  If you develop varicose veins, wear support hose. Elevate your feet for 15 minutes, 3-4 times a day. Limit salt in your diet.  Avoid heavy lifting, wear low heel shoes, and practice good posture.  Rest with your legs elevated if you have leg cramps or low back pain.  Visit your dentist if you have not gone yet during your pregnancy. Use a soft toothbrush to brush your teeth and be gentle when you floss.  A sexual relationship may be continued unless your health care provider directs you otherwise.  Continue to go to all your prenatal visits as directed by your health care provider. SEEK MEDICAL CARE IF:   You have dizziness.  You have mild pelvic cramps, pelvic pressure, or nagging pain in the abdominal area.  You have persistent nausea, vomiting, or diarrhea.  You have a bad smelling vaginal  discharge.  You have pain with urination. SEEK IMMEDIATE MEDICAL CARE IF:   You have a fever.  You are leaking fluid from your vagina.  You have spotting or bleeding from your vagina.  You have severe abdominal cramping or pain.  You have rapid weight gain or loss.  You have shortness of breath with chest pain.  You notice sudden or extreme swelling of your face, hands, ankles, feet, or legs.  You have not felt your baby move in over an hour.  You have severe headaches that do not go away with medicine.  You have vision changes. Document Released: 08/03/2001 Document Revised: 08/14/2013 Document Reviewed: 10/10/2012 [facility] Patient Information 2015 [place], [place]. This information is not intended to replace advice given to you by your health care provider. Make sure you discuss any questions you have with your health care provider.

## 2015-04-18 NOTE — Progress Notes (Signed)
Subjective:  Kim [name] is a 29 y.o. G2P1001 at [redacted]w[redacted]d being seen today for ongoing prenatal care.  Patient reports no complaints.  Contractions: Not present.  Vag. Bleeding: None. Movement: Present. Denies leaking of fluid.  Had some white discharge so went to MAU for evaluation, which was normal  The following portions of the patient's history were reviewed and updated as appropriate: allergies, current medications, past family history, past medical history, past social history, past surgical history and problem list.   Objective:   Filed Vitals:   04/18/15 1010  BP: 113/74  Pulse: 110  Weight: 164 lb (74.39 kg)    Fetal Status: Fetal Heart Rate (bpm): 147   Movement: Present     General:  Alert, oriented and cooperative. Patient is in no acute distress.  Skin: Skin is warm and dry. No rash noted.   Cardiovascular: Normal heart rate noted  Respiratory: Normal respiratory effort, no problems with respiration noted  Abdomen: Soft, gravid, appropriate for gestational age. Pain/Pressure: Absent     Pelvic: Vag. Bleeding: None Vag D/C Character: Thin   Cervical exam deferred        Extremities: Normal range of motion.  Edema: None  Mental Status: Normal mood and affect. Normal behavior. Normal judgment and thought content.   Urinalysis: Urine Protein: Negative Urine Glucose: Negative  Assessment and Plan:  Pregnancy: G2P1001 at [redacted]w[redacted]d  There are no diagnoses linked to this encounter. Preterm labor symptoms and general obstetric precautions including but not limited to vaginal bleeding, contractions, leaking of fluid and fetal movement were reviewed in detail with the patient. Please refer to After Visit Summary for other counseling recommendations.  Wants info on stages of labor. Never got to labor last time due to breech. Reviewed internet options and will provide info via EPIC  Return in about 3 weeks (around 05/09/2015) for [employer].   Seabron Spates, CNM

## 2015-05-09 ENCOUNTER — Encounter (HOSPITAL_COMMUNITY): Payer: Self-pay | Admitting: *Deleted

## 2015-05-09 ENCOUNTER — Inpatient Hospital Stay (HOSPITAL_COMMUNITY)
Admission: AD | Admit: 2015-05-09 | Discharge: 2015-05-09 | Disposition: A | Discharge status: Home / Self Care | Payer: Medicaid Other | Source: Ambulatory Visit | Attending: Obstetrics and Gynecology | Admitting: Obstetrics and Gynecology

## 2015-05-09 ENCOUNTER — Encounter: Payer: Medicaid Other | Admitting: Advanced Practice Midwife

## 2015-05-09 DIAGNOSIS — K1379 Other lesions of oral mucosa: Secondary | ICD-10-CM

## 2015-05-09 DIAGNOSIS — Z880 Allergy status to penicillin: Secondary | ICD-10-CM | POA: Insufficient documentation

## 2015-05-09 DIAGNOSIS — Z3A28 28 weeks gestation of pregnancy: Secondary | ICD-10-CM | POA: Diagnosis not present

## 2015-05-09 DIAGNOSIS — O9989 Other specified diseases and conditions complicating pregnancy, childbirth and the puerperium: Secondary | ICD-10-CM | POA: Diagnosis not present

## 2015-05-09 DIAGNOSIS — K116 Mucocele of salivary gland: Secondary | ICD-10-CM | POA: Insufficient documentation

## 2015-05-09 DIAGNOSIS — O26893 Other specified pregnancy related conditions, third trimester: Secondary | ICD-10-CM | POA: Diagnosis present

## 2015-05-09 NOTE — Discharge Instructions (Signed)
Gargle with salt water and or use chloraseptic spray as needed.   Upper Respiratory Infection, Adult An upper respiratory infection (URI) is also sometimes known as the common cold. The upper respiratory tract includes the nose, sinuses, throat, trachea, and bronchi. Bronchi are the airways leading to the lungs. Most people improve within 1 week, but symptoms can last up to 2 weeks. A residual cough may last even longer.  CAUSES Many different viruses can infect the tissues lining the upper respiratory tract. The tissues become irritated and inflamed and often become very moist. Mucus production is also common. A cold is contagious. You can easily spread the virus to others by oral contact. This includes kissing, sharing a glass, coughing, or sneezing. Touching your mouth or nose and then touching a surface, which is then touched by another person, can also spread the virus. SYMPTOMS  Symptoms typically develop 1 to 3 days after you come in contact with a cold virus. Symptoms vary from person to person. They may include:  Runny nose.  Sneezing.  Nasal congestion.  Sinus irritation.  Sore throat.  Loss of voice (laryngitis).  Cough.  Fatigue.  Muscle aches.  Loss of appetite.  Headache.  Low-grade fever. DIAGNOSIS  You might diagnose your own cold based on familiar symptoms, since most people get a cold 2 to 3 times a year. Your caregiver can confirm this based on your exam. Most importantly, your caregiver can check that your symptoms are not due to another disease such as strep throat, sinusitis, pneumonia, asthma, or epiglottitis. Blood tests, throat tests, and X-rays are not necessary to diagnose a common cold, but they may sometimes be helpful in excluding other more serious diseases. Your caregiver will decide if any further tests are required. RISKS AND COMPLICATIONS  You may be at risk for a more severe case of the common cold if you smoke cigarettes, have chronic heart  disease (such as heart failure) or lung disease (such as asthma), or if you have a weakened immune system. The very young and very old are also at risk for more serious infections. Bacterial sinusitis, middle ear infections, and bacterial pneumonia can complicate the common cold. The common cold can worsen asthma and chronic obstructive pulmonary disease (COPD). Sometimes, these complications can require emergency medical care and may be life-threatening. PREVENTION  The best way to protect against getting a cold is to practice good hygiene. Avoid oral or hand contact with people with cold symptoms. Wash your hands often if contact occurs. There is no clear evidence that vitamin C, vitamin E, echinacea, or exercise reduces the chance of developing a cold. However, it is always recommended to get plenty of rest and practice good nutrition. TREATMENT  Treatment is directed at relieving symptoms. There is no cure. Antibiotics are not effective, because the infection is caused by a virus, not by bacteria. Treatment may include:  Increased fluid intake. Sports drinks offer valuable electrolytes, sugars, and fluids.  Breathing heated mist or steam (vaporizer or shower).  Eating chicken soup or other clear broths, and maintaining good nutrition.  Getting plenty of rest.  Using gargles or lozenges for comfort.  Controlling fevers with ibuprofen or acetaminophen as directed by your caregiver.  Increasing usage of your inhaler if you have asthma. Zinc gel and zinc lozenges, taken in the first 24 hours of the common cold, can shorten the duration and lessen the severity of symptoms. Pain medicines may help with fever, muscle aches, and throat pain. A variety  of non-prescription medicines are available to treat congestion and runny nose. Your caregiver can make recommendations and may suggest nasal or lung inhalers for other symptoms.  HOME CARE INSTRUCTIONS   Only take over-the-counter or prescription  medicines for pain, discomfort, or fever as directed by your caregiver.  Use a warm mist humidifier or inhale steam from a shower to increase air moisture. This may keep secretions moist and make it easier to breathe.  Drink enough water and fluids to keep your urine clear or pale yellow.  Rest as needed.  Return to work when your temperature has returned to normal or as your caregiver advises. You may need to stay home longer to avoid infecting others. You can also use a face mask and careful hand washing to prevent spread of the virus. SEEK MEDICAL CARE IF:   After the first few days, you feel you are getting worse rather than better.  You need your caregiver's advice about medicines to control symptoms.  You develop chills, worsening shortness of breath, or brown or red sputum. These may be signs of pneumonia.  You develop yellow or brown nasal discharge or pain in the face, especially when you bend forward. These may be signs of sinusitis.  You develop a fever, swollen neck glands, pain with swallowing, or white areas in the back of your throat. These may be signs of strep throat. SEEK IMMEDIATE MEDICAL CARE IF:   You have a fever.  You develop severe or persistent headache, ear pain, sinus pain, or chest pain.  You develop wheezing, a prolonged cough, cough up blood, or have a change in your usual mucus (if you have chronic lung disease).  You develop sore muscles or a stiff neck. Document Released: 02/02/2001 Document Revised: 11/01/2011 Document Reviewed: 11/14/2013 [facility] Patient Information 2015 [place], [place]. This information is not intended to replace advice given to you by your health care provider. Make sure you discuss any questions you have with your health care provider.

## 2015-05-09 NOTE — MAU Provider Note (Signed)
Chief Complaint:  Bubble on uvula    None     HPI: Kim [name] is a 29 y.o. G2P1001 at [redacted]w[redacted]d who presents to maternity admissions reporting bubble on her uvula that started this morning making it uncomfortable to talk/swallow.  She reports no recent illness or exposure to illness.  She denies abdominal pain, cramping, or other pregnancy symptoms.  She reports good fetal movement, denies LOF, vaginal bleeding, vaginal itching/burning, urinary symptoms, h/a, dizziness, n/v, or fever/chills.    Mouth Lesions  The current episode started today. The problem has been unchanged. The problem is moderate. The symptoms are aggravated by eating and drinking. Associated symptoms include mouth sores. Pertinent negatives include no fever, no photophobia, no abdominal pain, no constipation, no diarrhea, no nausea, no vomiting, no congestion, no headaches, no rhinorrhea, no sore throat, no swollen glands, no muscle aches, no neck pain, no cough and no URI. She has been behaving normally. She has been eating and drinking normally. There were no sick contacts.    Past Medical History: Past Medical History  Diagnosis Date  . Fibroid     1.9cm  . 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 Anxiety   . GERD (gastroesophageal reflux disease)   . Vaginal Pap smear, abnormal   . HPV (human papilloma virus) infection     Past obstetric history: OB History  Gravida Para Term Preterm AB SAB TAB Ectopic Multiple Living  2 1 1       1     # Outcome Date GA Lbr Len/2nd Weight Sex Delivery Anes PTL Lv  2 Current           1 Term 06/07/13 [redacted]w[redacted]d  2.665 kg (5 lb 14 oz) F CS-LTranv Spinal  Y    Obstetric Comments  [name]    Past Surgical History: Past Surgical History  Procedure Laterality Date  . No past surgeries    . Cesarean section N/A 06/07/2013    Procedure: PRIMARY CESAREAN SECTION;  Surgeon: Shelly Bombard, MD;  Location: [facility];  Service: Obstetrics;  Laterality: N/A;    Family History: Family  History  Problem Relation Age of Onset  . Diabetes Father   . Diabetes Maternal Grandmother     Social History: Social History  Substance Use Topics  . Smoking status: Never Smoker   . Smokeless tobacco: Never Used  . Alcohol Use: No     Comment: rare  none  while  preg    Allergies:  Allergies  Allergen Reactions  . Naproxen Anaphylaxis    Can take ibuprofen  . Penicillins Other (See Comments)    Family history of allergy, patient prefers to not take this medication    Meds:  No prescriptions prior to admission    ROS:  Review of Systems  Constitutional: Negative for fever, chills and fatigue.  HENT: Positive for mouth sores. Negative for congestion, rhinorrhea, sinus pressure and sore throat.   Eyes: Negative for photophobia.  Respiratory: Negative for cough and shortness of breath.   Cardiovascular: Negative for chest pain.  Gastrointestinal: Negative for nausea, vomiting, abdominal pain, diarrhea and constipation.  Genitourinary: Negative for dysuria, frequency, flank pain, vaginal bleeding, vaginal discharge, difficulty urinating, vaginal pain and pelvic pain.  Musculoskeletal: Negative for neck pain.  Neurological: Negative for dizziness, weakness and headaches.  Psychiatric/Behavioral: Negative.      I have reviewed patient's Past Medical Hx, Surgical Hx, Family Hx, Social Hx, medications and allergies.   Physical Exam   Patient Vitals  for the past 24 hrs:  BP Temp Temp src Pulse Resp Height Weight  05/09/15 1246 104/57 mmHg 98.4 F (36.9 C) Oral 81 18 - -  05/09/15 1117 108/58 mmHg 97.9 F (36.6 C) Oral 84 14 5\' 5"  (1.651 m) 77.565 kg (171 lb)   Constitutional: Well-developed, well-nourished female in no acute distress.  HEENT:  0.5 cm round, pink, cyst-like structure noted on pt uvula Cardiovascular: normal rate Respiratory: normal effort GI: Abd soft, non-tender, gravid appropriate for gestational age.  MS: Extremities nontender, no edema, normal  ROM Neurologic: Alert and oriented x 4.  GU: Neg CVAT.     FHT:  Baseline 145 , moderate variability, accelerations present, no decelerations Contractions: none on toco or to palpation   Labs: No results found for this or any previous visit (from the past 24 hour(s)). O/POS/-- (04/29 5859)  Imaging:  No results found.  MAU Course/MDM: I have ordered labs and reviewed results.  Dr Gerlean Ren, family practice resident to room to assist with assessment.  Lesion likely benign, no medical management needed in MAU.  Reassurance provided to pt.  Pt stable at time of discharge.  Assessment: 1. Mucocele of mouth     Plan: Discharge home Gargle with saltwater BID and use OTC Chloraseptic PRN   Follow-up Information    Follow up with [facility] at [place].   Specialty:  Obstetrics and Gynecology   Why:  As scheduled or call if symptoms persist   Contact information:   [place], [address] [place] [phone]     Follow-up Information    Follow up with [facility] at [place].   Specialty:  Obstetrics and Gynecology   Why:  As scheduled or call if symptoms persist   Contact information:   [place], [place] Certified Nurse-Midwife 05/09/2015 2:22 PM

## 2015-05-09 NOTE — MAU Note (Signed)
Will come to unit to assess.

## 2015-05-09 NOTE — MAU Note (Signed)
Pt states here for bubble on her uvula that came up this am. Gives feeling of gagging. Did blow nose a lot this am. No pregnancy issues.

## 2015-05-16 ENCOUNTER — Encounter: Payer: Self-pay | Admitting: Obstetrics and Gynecology

## 2015-05-16 ENCOUNTER — Ambulatory Visit (INDEPENDENT_AMBULATORY_CARE_PROVIDER_SITE_OTHER): Payer: Medicaid Other | Admitting: Obstetrics and Gynecology

## 2015-05-16 VITALS — BP 111/70 | HR 96 | Wt 170.0 lb

## 2015-05-16 DIAGNOSIS — Z3492 Encounter for supervision of normal pregnancy, unspecified, second trimester: Secondary | ICD-10-CM

## 2015-05-16 DIAGNOSIS — Z36 Encounter for antenatal screening of mother: Secondary | ICD-10-CM | POA: Diagnosis not present

## 2015-05-16 DIAGNOSIS — Z3482 Encounter for supervision of other normal pregnancy, second trimester: Secondary | ICD-10-CM

## 2015-05-16 LAB — CBC
HCT: 38.3 % (ref 36.0–46.0)
Hemoglobin: 12.8 g/dL (ref 12.0–15.0)
MCH: 31.5 pg (ref 26.0–34.0)
MCHC: 33.4 g/dL (ref 30.0–36.0)
MCV: 94.3 fL (ref 78.0–100.0)
MPV: 11.1 fL (ref 8.6–12.4)
Platelets: 215 10*3/uL (ref 150–400)
RBC: 4.06 MIL/uL (ref 3.87–5.11)
RDW: 14 % (ref 11.5–15.5)
WBC: 9.9 10*3/uL (ref 4.0–10.5)

## 2015-05-16 NOTE — Progress Notes (Signed)
Subjective:  Kim [name] is a 29 y.o. G2P1001 at [redacted]w[redacted]d being seen today for ongoing prenatal care.  Patient reports no complaints.  Contractions: Not present.  Vag. Bleeding: None. Movement: Present. Denies leaking of fluid.   The following portions of the patient's history were reviewed and updated as appropriate: allergies, current medications, past family history, past medical history, past social history, past surgical history and problem list.   Objective:   Filed Vitals:   05/16/15 0913  BP: 111/70  Pulse: 96  Weight: 170 lb (77.111 kg)    Fetal Status:     Movement: Present    Supervision of normal pregnancy, second trimester - Plan: Glucose Tolerance, 1 HR (50g), CBC, HIV antibody, RPR, Tdap vaccine greater than or equal to 7yo IM Doing well. Still desires TOLAC   - Glucose Tolerance, 1 HR (50g) - CBC - HIV antibody - RPR - Tdap vaccine greater than or equal to 7yo IM  Preterm labor symptoms and general obstetric precautions including but not limited to vaginal bleeding, contractions, leaking of fluid and fetal movement were reviewed in detail with the patient. Please refer to After Visit Summary for other counseling recommendations.  Return in about 2 weeks (around 05/30/2015).   Lorene Dy, CNM

## 2015-05-16 NOTE — Progress Notes (Signed)
Pt wants to wait on vaccines until next visit 28wk labs today

## 2015-05-16 NOTE — Patient Instructions (Signed)
Third Trimester of Pregnancy The third trimester is from week 29 through week 42, months 7 through 9. The third trimester is a time when the fetus is growing rapidly. At the end of the ninth month, the fetus is about 20 inches in length and weighs 6-10 pounds.  BODY CHANGES Your body goes through many changes during pregnancy. The changes vary from woman to woman.   Your weight will continue to increase. You can expect to gain 25-35 pounds (11-16 kg) by the end of the pregnancy.  You may begin to get stretch marks on your hips, abdomen, and breasts.  You may urinate more often because the fetus is moving lower into your pelvis and pressing on your bladder.  You may develop or continue to have heartburn as a result of your pregnancy.  You may develop constipation because certain hormones are causing the muscles that push waste through your intestines to slow down.  You may develop hemorrhoids or swollen, bulging veins (varicose veins).  You may have pelvic pain because of the weight gain and pregnancy hormones relaxing your joints between the bones in your pelvis. Backaches may result from overexertion of the muscles supporting your posture.  You may have changes in your hair. These can include thickening of your hair, rapid growth, and changes in texture. Some women also have hair loss during or after pregnancy, or hair that feels dry or thin. Your hair will most likely return to normal after your baby is born.  Your breasts will continue to grow and be tender. A yellow discharge may leak from your breasts called colostrum.  Your belly button may stick out.  You may feel short of breath because of your expanding uterus.  You may notice the fetus "dropping," or moving lower in your abdomen.  You may have a bloody mucus discharge. This usually occurs a few days to a week before labor begins.  Your cervix becomes thin and soft (effaced) near your due date. WHAT TO EXPECT AT YOUR PRENATAL  EXAMS  You will have prenatal exams every 2 weeks until week 36. Then, you will have weekly prenatal exams. During a routine prenatal visit:  You will be weighed to make sure you and the fetus are growing normally.  Your blood pressure is taken.  Your abdomen will be measured to track your baby's growth.  The fetal heartbeat will be listened to.  Any test results from the previous visit will be discussed.  You may have a cervical check near your due date to see if you have effaced. At around 36 weeks, your caregiver will check your cervix. At the same time, your caregiver will also perform a test on the secretions of the vaginal tissue. This test is to determine if a type of bacteria, Group B streptococcus, is present. Your caregiver will explain this further. Your caregiver may ask you:  What your birth plan is.  How you are feeling.  If you are feeling the baby move.  If you have had any abnormal symptoms, such as leaking fluid, bleeding, severe headaches, or abdominal cramping.  If you have any questions. Other tests or screenings that may be performed during your third trimester include:  Blood tests that check for low iron levels (anemia).  Fetal testing to check the health, activity level, and growth of the fetus. Testing is done if you have certain medical conditions or if there are problems during the pregnancy. FALSE LABOR You may feel small, irregular contractions that   eventually go away. These are called Braxton Hicks contractions, or false labor. Contractions may last for hours, days, or even weeks before true labor sets in. If contractions come at regular intervals, intensify, or become painful, it is best to be seen by your caregiver.  SIGNS OF LABOR   Menstrual-like cramps.  Contractions that are 5 minutes apart or less.  Contractions that start on the top of the uterus and spread down to the lower abdomen and back.  A sense of increased pelvic pressure or back  pain.  A watery or bloody mucus discharge that comes from the vagina. If you have any of these signs before the 37th week of pregnancy, call your caregiver right away. You need to go to the hospital to get checked immediately. HOME CARE INSTRUCTIONS   Avoid all smoking, herbs, alcohol, and unprescribed drugs. These chemicals affect the formation and growth of the baby.  Follow your caregiver's instructions regarding medicine use. There are medicines that are either safe or unsafe to take during pregnancy.  Exercise only as directed by your caregiver. Experiencing uterine cramps is a good sign to stop exercising.  Continue to eat regular, healthy meals.  Wear a good support bra for breast tenderness.  Do not use hot tubs, steam rooms, or saunas.  Wear your seat belt at all times when driving.  Avoid raw meat, uncooked cheese, cat litter boxes, and soil used by cats. These carry germs that can cause birth defects in the baby.  Take your prenatal vitamins.  Try taking a stool softener (if your caregiver approves) if you develop constipation. Eat more high-fiber foods, such as fresh vegetables or fruit and whole grains. Drink plenty of fluids to keep your urine clear or pale yellow.  Take warm sitz baths to soothe any pain or discomfort caused by hemorrhoids. Use hemorrhoid cream if your caregiver approves.  If you develop varicose veins, wear support hose. Elevate your feet for 15 minutes, 3-4 times a day. Limit salt in your diet.  Avoid heavy lifting, wear low heal shoes, and practice good posture.  Rest a lot with your legs elevated if you have leg cramps or low back pain.  Visit your dentist if you have not gone during your pregnancy. Use a soft toothbrush to brush your teeth and be gentle when you floss.  A sexual relationship may be continued unless your caregiver directs you otherwise.  Do not travel far distances unless it is absolutely necessary and only with the approval  of your caregiver.  Take prenatal classes to understand, practice, and ask questions about the labor and delivery.  Make a trial run to the hospital.  Pack your hospital bag.  Prepare the baby's nursery.  Continue to go to all your prenatal visits as directed by your caregiver. SEEK MEDICAL CARE IF:  You are unsure if you are in labor or if your water has broken.  You have dizziness.  You have mild pelvic cramps, pelvic pressure, or nagging pain in your abdominal area.  You have persistent nausea, vomiting, or diarrhea.  You have a bad smelling vaginal discharge.  You have pain with urination. SEEK IMMEDIATE MEDICAL CARE IF:   You have a fever.  You are leaking fluid from your vagina.  You have spotting or bleeding from your vagina.  You have severe abdominal cramping or pain.  You have rapid weight loss or gain.  You have shortness of breath with chest pain.  You notice sudden or extreme swelling   of your face, hands, ankles, feet, or legs.  You have not felt your baby move in over an hour.  You have severe headaches that do not go away with medicine.  You have vision changes. Document Released: 08/03/2001 Document Revised: 08/14/2013 Document Reviewed: 10/10/2012 [employer] Patient Information 2015 [employer]. This information is not intended to replace advice given to you by your health care provider. Make sure you discuss any questions you have with your health care provider.  

## 2015-05-17 LAB — RPR

## 2015-05-17 LAB — HIV ANTIBODY (ROUTINE TESTING W REFLEX): HIV: NONREACTIVE

## 2015-05-17 LAB — GLUCOSE TOLERANCE, 1 HOUR (50G) W/O FASTING: Glucose, 1 Hour GTT: 142 mg/dL — ABNORMAL HIGH (ref 70–140)

## 2015-05-19 ENCOUNTER — Telehealth: Payer: Self-pay | Admitting: *Deleted

## 2015-05-19 NOTE — Telephone Encounter (Signed)
Pt notified of abnormal 1 hr GTT and she is to come in for fasting 3 hr GTT.  Pt states that she will check her schedule and call in the AM

## 2015-05-21 ENCOUNTER — Other Ambulatory Visit: Payer: Medicaid Other | Admitting: *Deleted

## 2015-05-22 ENCOUNTER — Telehealth: Payer: Self-pay | Admitting: *Deleted

## 2015-05-22 LAB — GLUCOSE TOLERANCE, 3 HOURS
GLUCOSE, 1 HOUR-GESTATIONAL: 123 mg/dL (ref 70–189)
GLUCOSE, 2 HOUR-GESTATIONAL: 119 mg/dL (ref 70–164)
GLUCOSE, FASTING-GESTATIONAL: 63 mg/dL — AB (ref 65–99)
Glucose, GTT - 3 Hour: 74 mg/dL (ref 70–144)

## 2015-05-22 NOTE — Telephone Encounter (Signed)
Pt notified of normal 3 hr GTT.

## 2015-05-30 ENCOUNTER — Ambulatory Visit (INDEPENDENT_AMBULATORY_CARE_PROVIDER_SITE_OTHER): Payer: Medicaid Other | Admitting: Advanced Practice Midwife

## 2015-05-30 VITALS — BP 120/74 | HR 102 | Wt 172.0 lb

## 2015-05-30 DIAGNOSIS — Z3483 Encounter for supervision of other normal pregnancy, third trimester: Secondary | ICD-10-CM

## 2015-05-30 DIAGNOSIS — Z3493 Encounter for supervision of normal pregnancy, unspecified, third trimester: Secondary | ICD-10-CM

## 2015-05-30 DIAGNOSIS — Z23 Encounter for immunization: Secondary | ICD-10-CM

## 2015-05-30 MED ORDER — TETANUS-DIPHTH-ACELL PERTUSSIS 5-2.5-18.5 LF-MCG/0.5 IM SUSP
0.5000 mL | Freq: Once | INTRAMUSCULAR | Status: AC
  Administered 2015-05-30: 0.5 mL via INTRAMUSCULAR

## 2015-05-30 MED ORDER — INFLUENZA VAC SPLIT QUAD 0.5 ML IM SUSY
0.5000 mL | PREFILLED_SYRINGE | Freq: Once | INTRAMUSCULAR | Status: AC
Start: 2015-05-30 — End: 2015-05-30
  Administered 2015-05-30: 0.5 mL via INTRAMUSCULAR

## 2015-05-30 NOTE — Patient Instructions (Signed)
Influenza (Flu) Vaccine (Inactivated or Recombinant):  1. Why get vaccinated? Influenza ("flu") is a contagious disease that spreads around the [place] every year, usually between October and May. Flu is caused by influenza viruses, and is spread mainly by coughing, sneezing, and close contact. Anyone can get flu. Flu strikes suddenly and can last several days. Symptoms vary by age, but can include:  fever/chills  sore throat  muscle aches  fatigue  cough  headache  runny or stuffy nose Flu can also lead to pneumonia and blood infections, and cause diarrhea and seizures in children. If you have a medical condition, such as heart or lung disease, flu can make it worse. Flu is more dangerous for some people. Infants and young children, people 65 years of age and older, pregnant women, and people with certain health conditions or a weakened immune system are at greatest risk. Each year thousands of people in the [place] die from flu, and many more are hospitalized. Flu vaccine can:  keep you from getting flu,  make flu less severe if you do get it, and  keep you from spreading flu to your family and other people. 2. Inactivated and recombinant flu vaccines A dose of flu vaccine is recommended every flu season. Children 6 months through 8 years of age may need two doses during the same flu season. Everyone else needs only one dose each flu season. Some inactivated flu vaccines contain a very small amount of a mercury-based preservative called thimerosal. Studies have not shown thimerosal in vaccines to be harmful, but flu vaccines that do not contain thimerosal are available. There is no live flu virus in flu shots. They cannot cause the flu. There are many flu viruses, and they are always changing. Each year a new flu vaccine is made to protect against three or four viruses that are likely to cause disease in the upcoming flu season. But even when the vaccine doesn't exactly  match these viruses, it may still provide some protection. Flu vaccine cannot prevent:  flu that is caused by a virus not covered by the vaccine, or  illnesses that look like flu but are not. It takes about 2 weeks for protection to develop after vaccination, and protection lasts through the flu season. 3. Some people should not get this vaccine Tell the person who is giving you the vaccine:  If you have any severe, life-threatening allergies. If you ever had a life-threatening allergic reaction after a dose of flu vaccine, or have a severe allergy to any part of this vaccine, you may be advised not to get vaccinated. Most, but not all, types of flu vaccine contain a small amount of egg protein.  If you ever had Guillain-Barre Syndrome (also called GBS). Some people with a history of GBS should not get this vaccine. This should be discussed with your doctor.  If you are not feeling well. It is usually okay to get flu vaccine when you have a mild illness, but you might be asked to come back when you feel better. 4. Risks of a vaccine reaction With any medicine, including vaccines, there is a chance of reactions. These are usually mild and go away on their own, but serious reactions are also possible. Most people who get a flu shot do not have any problems with it. Minor problems following a flu shot include:  soreness, redness, or swelling where the shot was given  hoarseness  sore, red or itchy eyes  cough    fever  aches  headache  itching  fatigue If these problems occur, they usually begin soon after the shot and last 1 or 2 days. More serious problems following a flu shot can include the following:  There may be a small increased risk of Guillain-Barre Syndrome (GBS) after inactivated flu vaccine. This risk has been estimated at 1 or 2 additional cases per million people vaccinated. This is much lower than the risk of severe complications from flu, which can be prevented by  flu vaccine.  Young children who get the flu shot along with pneumococcal vaccine (PCV13) and/or DTaP vaccine at the same time might be slightly more likely to have a seizure caused by fever. Ask your doctor for more information. Tell your doctor if a child who is getting flu vaccine has ever had a seizure. Problems that could happen after any injected vaccine:  People sometimes faint after a medical procedure, including vaccination. Sitting or lying down for about 15 minutes can help prevent fainting, and injuries caused by a fall. Tell your doctor if you feel dizzy, or have vision changes or ringing in the ears.  Some people get severe pain in the shoulder and have difficulty moving the arm where a shot was given. This happens very rarely.  Any medication can cause a severe allergic reaction. Such reactions from a vaccine are very rare, estimated at about 1 in a million doses, and would happen within a few minutes to a few hours after the vaccination. As with any medicine, there is a very remote chance of a vaccine causing a serious injury or death. The safety of vaccines is always being monitored. For more information, visit: www.cdc.gov/vaccinesafety/ 5. What if there is a serious reaction? What should I look for?  Look for anything that concerns you, such as signs of a severe allergic reaction, very high fever, or unusual behavior. Signs of a severe allergic reaction can include hives, swelling of the face and throat, difficulty breathing, a fast heartbeat, dizziness, and weakness. These would start a few minutes to a few hours after the vaccination. What should I do?  If you think it is a severe allergic reaction or other emergency that can't wait, call 9-1-1 and get the person to the nearest hospital. Otherwise, call your doctor.  Reactions should be reported to the Vaccine Adverse Event Reporting System (VAERS). Your doctor should file this report, or you can do it yourself through the  VAERS web site at www.vaers.hhs.gov, or by calling 1-800-822-7967. VAERS does not give medical advice. 6. The National Vaccine Injury Compensation Program The National Vaccine Injury Compensation Program (VICP) is a federal program that was created to compensate people who may have been injured by certain vaccines. Persons who believe they may have been injured by a vaccine can learn about the program and about filing a claim by calling 1-800-338-2382 or visiting the VICP website at www.hrsa.gov/vaccinecompensation. There is a time limit to file a claim for compensation. 7. How can I learn more?  Ask your healthcare provider. He or she can give you the vaccine package insert or suggest other sources of information.  Call your local or state health department.  Contact the Centers for Disease Control and Prevention (CDC):  Call 1-800-232-4636 (1-800-CDC-INFO) or  Visit CDC's website at www.cdc.gov/flu Vaccine Information Statement Inactivated Influenza Vaccine (03/29/2014)   This information is not intended to replace advice given to you by your health care provider. Make sure you discuss any questions you have with   your health care provider.   Document Released: 06/03/2006 Document Revised: 08/30/2014 Document Reviewed: 04/01/2014 Elsevier Interactive Patient Education 2016 Else[employer] Inc[employer] Td Vaccine (Tetanus and Diphtheria): What You Need to Know 1. Why get vaccinated? Tetanus  and diphtheria are very serious diseases. They are rare in the [place] today, but people who do become infected often have severe complications. Td vaccine is used to protect adolescents and adults from both of these diseases. Both tetanus and diphtheria are infections caused by bacteria. Diphtheria spreads from person to person through coughing or sneezing. Tetanus-causing bacteria enter the body through cuts, scratches, or wounds. TETANUS (Lockjaw) causes painful muscle tightening and stiffness, usually all  over the body.  It can lead to tightening of muscles in the head and neck so you can't open your mouth, swallow, or sometimes even breathe. Tetanus kills about 1 out of every 10 people who are infected even after receiving the best medical care. DIPHTHERIA can cause a thick coating to form in the back of the throat.  It can lead to breathing problems, paralysis, heart failure, and death. Before vaccines, as many as 200,000 cases of diphtheria and hundreds of cases of tetanus were reported in the [place] each year. Since vaccination began, reports of cases for both diseases have dropped by about 99%. 2. Td vaccine Td vaccine can protect adolescents and adults from tetanus and diphtheria. Td is usually given as a booster dose every 10 years but it can also be given earlier after a severe and dirty wound or burn. Another vaccine, called Tdap, which protects against pertussis in addition to tetanus and diphtheria, is sometimes recommended instead of Td vaccine. Your doctor or the person giving you the vaccine can give you more information. Td may safely be given at the same time as other vaccines. 3. Some people should not get this vaccine  A person who has ever had a life-threatening allergic reaction after a previous dose of any tetanus or diphtheria containing vaccine, OR has a severe allergy to any part of this vaccine, should not get Td vaccine. Tell the person giving the vaccine about any severe allergies.  Talk to your doctor if you:  have seizures or another nervous system problem,  had severe pain or swelling after any vaccine containing diphtheria or tetanus,  ever had a condition called Guillain Barre Syndrome (GBS),  aren't feeling well on the day the shot is scheduled. 4. Risks of a vaccine reaction With any medicine, including vaccines, there is a chance of side effects. These are usually mild and go away on their own. Serious reactions are also possible but are rare. Most  people who get Td vaccine do not have any problems with it. Mild Problems  following Td vaccine: (Did not interfere with activities)  Pain where the shot was given (about 8 people in 10)  Redness or swelling where the shot was given (about 1 person in 4)  Mild fever (rare)  Headache (about 1 person in 4)  Tiredness (about 1 person in 4) Moderate Problems following Td vaccine: (Interfered with activities, but did not require medical attention)  Fever over 102F (rare) Severe Problems  following Td vaccine: (Unable to perform usual activities; required medical attention)  Swelling, severe pain, bleeding and/or redness in the arm where the shot was given (rare). Problems that could happen after any vaccine:  People sometimes faint after a medical procedure, including vaccination. Sitting or lying down for about 15 minutes can help prevent fainting,   and injuries caused by a fall. Tell your doctor if you feel dizzy, or have vision changes or ringing in the ears.  Some people get severe pain in the shoulder and have difficulty moving the arm where a shot was given. This happens very rarely.  Any medication can cause a severe allergic reaction. Such reactions from a vaccine are very rare, estimated at fewer than 1 in a million doses, and would happen within a few minutes to a few hours after the vaccination. As with any medicine, there is a very remote chance of a vaccine causing a serious injury or death. The safety of vaccines is always being monitored. For more information, visit: www.cdc.gov/vaccinesafety/ 5. What if there is a serious reaction? What should I look for?  Look for anything that concerns you, such as signs of a severe allergic reaction, very high fever, or unusual behavior. Signs of a severe allergic reaction can include hives, swelling of the face and throat, difficulty breathing, a fast heartbeat, dizziness, and weakness. These would usually start a few minutes to a few  hours after the vaccination. What should I do?  If you think it is a severe allergic reaction or other emergency that can't wait, call 9-1-1 or get the person to the nearest hospital. Otherwise, call your doctor.  Afterward, the reaction should be reported to the Vaccine Adverse Event Reporting System (VAERS). Your doctor might file this report, or you can do it yourself through the VAERS web site at www.vaers.hhs.gov, or by calling 1-800-822-7967. VAERS does not give medical advice. 6. The National Vaccine Injury Compensation Program The National Vaccine Injury Compensation Program (VICP) is a federal program that was created to compensate people who may have been injured by certain vaccines. Persons who believe they may have been injured by a vaccine can learn about the program and about filing a claim by calling 1-800-338-2382 or visiting the VICP website at www.hrsa.gov/vaccinecompensation. There is a time limit to file a claim for compensation. 7. How can I learn more?  Ask your doctor. He or she can give you the vaccine package insert or suggest other sources of information.  Call your local or state health department.  Contact the Centers for Disease Control and Prevention (CDC):  Call 1-800-232-4636 (1-800-CDC-INFO)  Visit CDC's website at www.cdc.gov/vaccines CDC Td Vaccine VIS (10/16/13)   This information is not intended to replace advice given to you by your health care provider. Make sure you discuss any questions you have with your health care provider.   Document Released: 06/06/2006 Document Revised: 08/30/2014 Document Reviewed: 11/21/2013 Elsevier Interactive Patient Education 2016 Elsevier Inc.  

## 2015-05-30 NOTE — Progress Notes (Signed)
Subjective:  Kim [name] is a 29 y.o. G2P1001 at [redacted]w[redacted]d being seen today for ongoing prenatal care.  Patient reports no complaints.  Contractions: Not present.  Vag. Bleeding: None. Movement: Present. Denies leaking of fluid.   The following portions of the patient's history were reviewed and updated as appropriate: allergies, current medications, past family history, past medical history, past social history, past surgical history and problem list.   Objective:   Filed Vitals:   05/30/15 0917  BP: 120/74  Pulse: 102  Weight: 78.019 kg (172 lb)    Fetal Status: Fetal Heart Rate (bpm): 156   Movement: Present     General:  Alert, oriented and cooperative. Patient is in no acute distress.  Skin: Skin is warm and dry. No rash noted.   Cardiovascular: Normal heart rate noted  Respiratory: Normal respiratory effort, no problems with respiration noted  Abdomen: Soft, gravid, appropriate for gestational age. Pain/Pressure: Absent     Pelvic: Vag. Bleeding: None Vag D/C Character: Thin   Cervical exam deferred        Extremities: Normal range of motion.  Edema: None  Mental Status: Normal mood and affect. Normal behavior. Normal judgment and thought content.   Urinalysis:      Assessment and Plan:  Pregnancy: G2P1001 at [redacted]w[redacted]d  1. Immunization due     Vaccines done today - Influenza vac split quadrivalent PF (FLUARIX) injection 0.5 mL; Inject 0.5 mLs into the muscle once. - Tdap (BOOSTRIX) injection 0.5 mL; Inject 0.5 mLs into the muscle once.  2. Supervision of normal pregnancy, third trimester      Reviewed normal 3 hour GTT results  Preterm labor symptoms and general obstetric precautions including but not limited to vaginal bleeding, contractions, leaking of fluid and fetal movement were reviewed in detail with the patient. Please refer to After Visit Summary for other counseling recommendations.  Return to office in 2 weeks   Seabron Spates, CNM

## 2015-06-10 ENCOUNTER — Encounter: Payer: Self-pay | Admitting: *Deleted

## 2015-06-13 ENCOUNTER — Ambulatory Visit (INDEPENDENT_AMBULATORY_CARE_PROVIDER_SITE_OTHER): Payer: Medicaid Other | Admitting: Family

## 2015-06-13 VITALS — BP 111/73 | HR 79 | Temp 98.3°F | Wt 173.0 lb

## 2015-06-13 DIAGNOSIS — N898 Other specified noninflammatory disorders of vagina: Secondary | ICD-10-CM | POA: Diagnosis not present

## 2015-06-13 DIAGNOSIS — Z3483 Encounter for supervision of other normal pregnancy, third trimester: Secondary | ICD-10-CM

## 2015-06-13 DIAGNOSIS — Z3493 Encounter for supervision of normal pregnancy, unspecified, third trimester: Secondary | ICD-10-CM

## 2015-06-13 NOTE — Progress Notes (Signed)
Subjective:  Kim [name] is a 29 y.o. G2P1001 at [redacted]w[redacted]d being seen today for ongoing prenatal care.  Patient reports occasional contractions and reports feeling increased Braxton Hicks (3-4) x one hour and pelvic pressure.  White vaginal discharge on underwear last night.  .  Contractions: Irritability.   . Movement: Present. Denies leaking of fluid.   The following portions of the patient's history were reviewed and updated as appropriate: allergies, current medications, past family history, past medical history, past social history, past surgical history and problem list. Problem list updated.  Objective:   Filed Vitals:   06/13/15 1019  BP: 111/73  Pulse: 79  Temp: 98.3 F (36.8 C)  Weight: 173 lb (78.472 kg)    Fetal Status:     Movement: Present     General:  Alert, oriented and cooperative. Patient is in no acute distress.  Skin: Skin is warm and dry. No rash noted.   Cardiovascular: Normal heart rate noted  Respiratory: Normal respiratory effort, no problems with respiration noted  Abdomen: Soft, gravid, appropriate for gestational age. Pain/Pressure: Present     Pelvic:   Vag D/C Character: Watery   Cervical exam performed      closed/thick; thick white discharge, vagina red; negative fern, neg pooling, nitrazine negative  Extremities: Normal range of motion.  Edema: None  Mental Status: Normal mood and affect. Normal behavior. Normal judgment and thought content.   Urinalysis: Urine Protein: Negative Urine Glucose: Negative  Assessment and Plan:  Pregnancy: G2P1001 at [redacted]w[redacted]d  1. Supervision of normal pregnancy, third trimester - Explained normalcy of BH and what to report  2. Vaginal Discharge - wet prep  Preterm labor symptoms and general obstetric precautions including but not limited to vaginal bleeding, contractions, leaking of fluid and fetal movement were reviewed in detail with the patient. Please refer to After Visit Summary for other counseling  recommendations.  Return in about 2 weeks (around 06/27/2015).   Venia Carbon Michiel Cowboy, CNM

## 2015-06-17 LAB — WET PREP, GENITAL
Clue Cells Wet Prep HPF POC: NONE SEEN
TRICH WET PREP: NONE SEEN
YEAST WET PREP: NONE SEEN

## 2015-06-27 ENCOUNTER — Ambulatory Visit (INDEPENDENT_AMBULATORY_CARE_PROVIDER_SITE_OTHER): Payer: Medicaid Other | Admitting: Advanced Practice Midwife

## 2015-06-27 VITALS — BP 119/67 | HR 90 | Wt 175.0 lb

## 2015-06-27 DIAGNOSIS — O34219 Maternal care for unspecified type scar from previous cesarean delivery: Secondary | ICD-10-CM

## 2015-06-27 DIAGNOSIS — Z3493 Encounter for supervision of normal pregnancy, unspecified, third trimester: Secondary | ICD-10-CM

## 2015-06-27 DIAGNOSIS — Z3483 Encounter for supervision of other normal pregnancy, third trimester: Secondary | ICD-10-CM

## 2015-06-27 NOTE — Patient Instructions (Signed)
Braxton Hicks Contractions °Contractions of the uterus can occur throughout pregnancy. Contractions are not always a sign that you are in labor.  °WHAT ARE BRAXTON HICKS CONTRACTIONS?  °Contractions that occur before labor are called Braxton Hicks contractions, or false labor. Toward the end of pregnancy (32-34 weeks), these contractions can develop more often and may become more forceful. This is not true labor because these contractions do not result in opening (dilatation) and thinning of the cervix. They are sometimes difficult to tell apart from true labor because these contractions can be forceful and people have different pain tolerances. You should not feel embarrassed if you go to the hospital with false labor. Sometimes, the only way to tell if you are in true labor is for your health care provider to look for changes in the cervix. °If there are no prenatal problems or other health problems associated with the pregnancy, it is completely safe to be sent home with false labor and await the onset of true labor. °HOW CAN YOU TELL THE DIFFERENCE BETWEEN TRUE AND FALSE LABOR? °False Labor °· The contractions of false labor are usually shorter and not as hard as those of true labor.   °· The contractions are usually irregular.   °· The contractions are often felt in the front of the lower abdomen and in the groin.   °· The contractions may go away when you walk around or change positions while lying down.   °· The contractions get weaker and are shorter lasting as time goes on.   °· The contractions do not usually become progressively stronger, regular, and closer together as with true labor.   °True Labor °· Contractions in true labor last 30-70 seconds, become very regular, usually become more intense, and increase in frequency.   °· The contractions do not go away with walking.   °· The discomfort is usually felt in the top of the uterus and spreads to the lower abdomen and low back.   °· True labor can be  determined by your health care provider with an exam. This will show that the cervix is dilating and getting thinner.   °WHAT TO REMEMBER °· Keep up with your usual exercises and follow other instructions given by your health care provider.   °· Take medicines as directed by your health care provider.   °· Keep your regular prenatal appointments.   °· Eat and drink lightly if you think you are going into labor.   °· If Braxton Hicks contractions are making you uncomfortable:   °¨ Change your position from lying down or resting to walking, or from walking to resting.   °¨ Sit and rest in a tub of warm water.   °¨ Drink 2-3 glasses of water. Dehydration may cause these contractions.   °¨ Do slow and deep breathing several times an hour.   °WHEN SHOULD I SEEK IMMEDIATE MEDICAL CARE? °Seek immediate medical care if: °· Your contractions become stronger, more regular, and closer together.   °· You have fluid leaking or gushing from your vagina.   °· You have a fever.   °· You pass blood-tinged mucus.   °· You have vaginal bleeding.   °· You have continuous abdominal pain.   °· You have low back pain that you never had before.   °· You feel your baby's head pushing down and causing pelvic pressure.   °· Your baby is not moving as much as it used to.   °  °This information is not intended to replace advice given to you by your health care provider. Make sure you discuss any questions you have with your health care   provider. °  °Document Released: 08/09/2005 Document Revised: 08/14/2013 Document Reviewed: 05/21/2013 °Elsevier Interactive Patient Education ©2016 [employer] Inc. ° °

## 2015-06-27 NOTE — Progress Notes (Signed)
Subjective:  Kim [name] is a 29 y.o. G2P1001 at [redacted]w[redacted]d being seen today for ongoing prenatal care.  Patient reports occasional contractions.  Contractions: Not present.  Vag. Bleeding: None. Movement: Absent. Denies leaking of fluid.   The following portions of the patient's history were reviewed and updated as appropriate: allergies, current medications, past family history, past medical history, past social history, past surgical history and problem list. Problem list updated.  Objective:   Filed Vitals:   06/27/15 1000  BP: 119/67  Pulse: 90  Weight: 175 lb (79.379 kg)    Fetal Status:     Movement: Absent     General:  Alert, oriented and cooperative. Patient is in no acute distress.  Skin: Skin is warm and dry. No rash noted.   Cardiovascular: Normal heart rate noted  Respiratory: Normal respiratory effort, no problems with respiration noted  Abdomen: Soft, gravid, appropriate for gestational age. Pain/Pressure: Absent     Pelvic: Vag. Bleeding: None Vag D/C Character: Curdy   Cervical exam deferred        Extremities: Normal range of motion.  Edema: None  Mental Status: Normal mood and affect. Normal behavior. Normal judgment and thought content.   Urinalysis: Urine Protein: Trace Urine Glucose: Negative  Assessment and Plan:  Pregnancy: G2P1001 at [redacted]w[redacted]d  1. Supervision of normal pregnancy, third trimester   Preterm labor symptoms and general obstetric precautions including but not limited to vaginal bleeding, contractions, leaking of fluid and fetal movement were reviewed in detail with the patient. Please refer to After Visit Summary for other counseling recommendations.  Return in about 1 week (around 07/04/2015).   Manya Silvas, CNM

## 2015-07-02 LAB — OB RESULTS CONSOLE GBS: STREP GROUP B AG: NEGATIVE

## 2015-07-02 LAB — OB RESULTS CONSOLE GC/CHLAMYDIA: Gonorrhea: NEGATIVE

## 2015-07-03 ENCOUNTER — Encounter: Payer: Medicaid Other | Admitting: Obstetrics & Gynecology

## 2015-07-04 ENCOUNTER — Ambulatory Visit (INDEPENDENT_AMBULATORY_CARE_PROVIDER_SITE_OTHER): Payer: Medicaid Other | Admitting: Certified Nurse Midwife

## 2015-07-04 ENCOUNTER — Other Ambulatory Visit: Payer: Self-pay | Admitting: Certified Nurse Midwife

## 2015-07-04 VITALS — BP 130/82 | HR 102 | Wt 176.0 lb

## 2015-07-04 DIAGNOSIS — Z36 Encounter for antenatal screening of mother: Secondary | ICD-10-CM

## 2015-07-04 DIAGNOSIS — Z3483 Encounter for supervision of other normal pregnancy, third trimester: Secondary | ICD-10-CM

## 2015-07-04 NOTE — Patient Instructions (Signed)
Group B streptococcus (GBS) is a type of bacteria often found in healthy women. GBS is not the same as the bacteria that causes strep throat. You may have GBS in your vagina, rectum, or bladder. GBS does not spread through sexual contact, but it can be passed to a baby during childbirth. This can be dangerous for your baby. It is not dangerous to you and usually does not cause any symptoms. Your health care provider may test you for GBS when your pregnancy is between 35 and 37 weeks. GBS is dangerous only during birth, so there is no need to test for it earlier. It is possible to have GBS during pregnancy and never pass it to your baby. If your test results are positive for GBS, your health care provider may recommend giving you antibiotic medicine during delivery to make sure your baby stays healthy. RISK FACTORS You are more likely to pass GBS to your baby if:   Your water breaks (ruptured membrane) or you go into labor before 37 weeks.  Your water breaks 18 hours before you deliver.  You passed GBS during a previous pregnancy.  You have a urinary tract infection caused by GBS any time during pregnancy.  You have a fever during labor. SYMPTOMS Most women who have GBS do not have any symptoms. If you have a urinary tract infection caused by GBS, you might have frequent or painful urination and fever. Babies who get GBS usually show symptoms within 7 days of birth. Symptoms may include:   Breathing problems.  Heart and blood pressure problems.  Digestive and kidney problems. DIAGNOSIS Routine screening for GBS is recommended for all pregnant women. A health care provider takes a sample of the fluid in your vagina and rectum with a swab. It is then sent to a lab to be checked for GBS. A sample of your urine may also be checked for the bacteria.  TREATMENT If you test positive for GBS, you may need treatment with an antibiotic medicine during labor. As soon as you go into labor, or as soon as  your membranes rupture, you will get the antibiotic medicine through an IV access. You will continue to get the medicine until after you give birth. You do not need antibiotic medicine if you are having a cesarean delivery.If your baby shows signs or symptoms of GBS after birth, your baby can also be treated with an antibiotic medicine. HOME CARE INSTRUCTIONS   Take all antibiotic medicine as prescribed by your health care provider. Only take medicine as directed.   Continue with prenatal visits and care.   Keep all follow-up appointments.  SEEK MEDICAL CARE IF:   You have pain when you urinate.   You have to urinate frequently.   You have a fever.  SEEK IMMEDIATE MEDICAL CARE IF:   Your membranes rupture.  You go into labor.   This information is not intended to replace advice given to you by your health care provider. Make sure you discuss any questions you have with your health care provider.   Document Released: 11/16/2007 Document Revised: 08/14/2013 Document Reviewed: 06/01/2013 Elsevier Interactive Patient Education 2016 Elsevier Inc.  

## 2015-07-04 NOTE — Progress Notes (Signed)
Subjective:  Kim [name] is a 29 y.o. G2P1001 at [redacted]w[redacted]d being seen today for ongoing prenatal care.  Patient reports occasional contractions.  Contractions: Not present.  Vag. Bleeding: None. Movement: Present. Denies leaking of fluid.   The following portions of the patient's history were reviewed and updated as appropriate: allergies, current medications, past family history, past medical history, past social history, past surgical history and problem list. Problem list updated.  Objective:   Filed Vitals:   07/04/15 0955  BP: 130/82  Pulse: 102  Weight: 176 lb (79.833 kg)    Fetal Status:     Movement: Present     General:  Alert, oriented and cooperative. Patient is in no acute distress.  Skin: Skin is warm and dry. No rash noted.   Cardiovascular: Normal heart rate noted  Respiratory: Normal respiratory effort, no problems with respiration noted  Abdomen: Soft, gravid, appropriate for gestational age. Pain/Pressure: Present     Pelvic: Vag. Bleeding: None Vag D/C Character: White   Cervical exam deferred        Extremities: Normal range of motion.  Edema: None  Mental Status: Normal mood and affect. Normal behavior. Normal judgment and thought content.   Urinalysis: Urine Protein: Negative Urine Glucose: Negative  Assessment and Plan:  Pregnancy: G2P1001 at [redacted]w[redacted]d  Cultures done today  There are no diagnoses linked to this encounter. Preterm labor symptoms and general obstetric precautions including but not limited to vaginal bleeding, contractions, leaking of fluid and fetal movement were reviewed in detail with the patient. Please refer to After Visit Summary for other counseling recommendations.  Return in about 1 week (around 07/11/2015).   Larey Days, CNM

## 2015-07-04 NOTE — Addendum Note (Signed)
Addended by: Gretchen Short on: 07/04/2015 10:47 AM   Modules accepted: Orders

## 2015-07-05 LAB — GC/CHLAMYDIA PROBE AMP
CT Probe RNA: NEGATIVE
GC Probe RNA: NEGATIVE

## 2015-07-07 LAB — CULTURE, STREPTOCOCCUS GRP B W/SUSCEPT

## 2015-07-11 ENCOUNTER — Ambulatory Visit (INDEPENDENT_AMBULATORY_CARE_PROVIDER_SITE_OTHER): Payer: Medicaid Other | Admitting: Obstetrics and Gynecology

## 2015-07-11 VITALS — BP 116/78 | HR 84 | Wt 178.0 lb

## 2015-07-11 DIAGNOSIS — Z3483 Encounter for supervision of other normal pregnancy, third trimester: Secondary | ICD-10-CM

## 2015-07-11 DIAGNOSIS — Z3493 Encounter for supervision of normal pregnancy, unspecified, third trimester: Secondary | ICD-10-CM

## 2015-07-11 NOTE — Patient Instructions (Signed)
Vaginal Birth After Cesarean Delivery Vaginal birth after cesarean delivery (VBAC) is giving birth vaginally after previously delivering a baby by a cesarean. In the past, if a woman had a cesarean delivery, all births afterward would be done by cesarean delivery. This is no longer true. It can be safe for the mother to try a vaginal delivery after having a cesarean delivery.  It is important to discuss VBAC with your health care provider early in the pregnancy so you can understand the risks, benefits, and options. It will give you time to decide what is best in your particular case. The final decision about whether to have a VBAC or repeat cesarean delivery should be between you and your health care provider. Any changes in your health or your baby's health during your pregnancy may make it necessary to change your initial decision about VBAC.  WOMEN WHO PLAN TO HAVE A VBAC SHOULD CHECK WITH THEIR HEALTH CARE PROVIDER TO BE SURE THAT:  The previous cesarean delivery was done with a low transverse uterine cut (incision) (not a vertical classical incision).   The birth canal is big enough for the baby.   There were no other operations on the uterus.   An electronic fetal monitor (EFM) will be on at all times during labor.   An operating room will be available and ready in case an emergency cesarean delivery is needed.   A health care provider and surgical nursing staff will be available at all times during labor to be ready to do an emergency delivery cesarean if necessary.   An anesthesiologist will be present in case an emergency cesarean delivery is needed.   The nursery is prepared and has adequate personnel and necessary equipment available to care for the baby in case of an emergency cesarean delivery. BENEFITS OF VBAC  Shorter stay in the hospital.   Avoidance of risks associated with cesarean delivery, such as:  Surgical complications, such as opening of the incision or  hernia in the incision.  Injury to other organs.  Fever. This can occur if an infection develops after surgery. It can also occur as a reaction to the medicine given to make you numb during the surgery.  Less blood loss and need for blood transfusions.  Lower risk of blood clots and infection.  Shorter recovery.   Decreased risk for having to remove the uterus (hysterectomy).   Decreased risk for the placenta to completely or partially cover the opening of the uterus (placenta previa) with a future pregnancy.   Decrease risk in future labor and delivery. RISKS OF A VBAC  Tearing (rupture) of the uterus. This is occurs in less than 1% of VBACs. The risk of this happening is higher if:  Steps are taken to begin the labor process (induce labor) or stimulate or strengthen contractions (augment labor).   Medicine is used to soften (ripen) the cervix.  Having to remove the uterus (hysterectomy) if it ruptures. VBAC SHOULD NOT BE DONE IF:  The previous cesarean delivery was done with a vertical (classical) or T-shaped incision or you do not know what kind of incision was made.   You had a ruptured uterus.   You have had certain types of surgery on your uterus, such as removal of uterine fibroids. Ask your health care provider about other types of surgeries that prevent you from having a VBAC.  You have certain medical or childbirth (obstetrical) problems.   There are problems with the baby.   You  Ask your health care provider about other types of surgeries that prevent you from having a VBAC.  · You have certain medical or childbirth (obstetrical) problems.    · There are problems with the baby.    · You have had two previous cesarean deliveries and no vaginal deliveries.  OTHER FACTS TO KNOW ABOUT VBAC:  · It is safe to have an epidural anesthetic with VBAC.    · It is safe to turn the baby from a breech position (attempt an external cephalic version).    · It is safe to try a VBAC with twins.    · VBAC may not be successful if your baby weights 8.8 lb (4 kg) or more. However, weight predictions are not always accurate and should not be used alone to decide if VBAC is right for  you.  · There is an increased failure rate if the time between the cesarean delivery and VBAC is less than 19 months.    · Your health care provider may advise against a VBAC if you have preeclampsia (high blood pressure, protein in the urine, and swelling of face and extremities).    · VBAC is often successful if you previously gave birth vaginally.    · VBAC is often successful when the labor starts spontaneously before the due date.    · Delivering a baby through a VBAC is similar to having a normal spontaneous vaginal delivery.     This information is not intended to replace advice given to you by your health care provider. Make sure you discuss any questions you have with your health care provider.     Document Released: 01/30/2007 Document Revised: 08/30/2014 Document Reviewed: 03/08/2013  Elsevier Interactive Patient Education ©2016 Elsevier Inc.

## 2015-07-11 NOTE — Progress Notes (Signed)
Subjective:  Kim [name] is a 29 y.o. G2P1001 at [redacted]w[redacted]d being seen today for ongoing prenatal care.  Patient reports no complaints.  Contractions: Irregular.  Vag. Bleeding: None. Movement: Present. Denies leaking of fluid. Having irregular UCs and wants cx check. Still desires TOLAC.   The following portions of the patient's history were reviewed and updated as appropriate: allergies, current medications, past family history, past medical history, past social history, past surgical history and problem list. Problem list updated.  Objective:   Filed Vitals:   07/11/15 0957  BP: 116/78  Pulse: 84  Weight: 178 lb (80.74 kg)    Fetal Status:     Movement: Present     General:  Alert, oriented and cooperative. Patient is in no acute distress.  Skin: Skin is warm and dry. No rash noted.   Cardiovascular: Normal heart rate noted  Respiratory: Normal respiratory effort, no problems with respiration noted  Abdomen: Soft, gravid, appropriate for gestational age. Pain/Pressure: Present     Pelvic: Vag. Bleeding: None Vag D/C Character: Thin   Cervical exam performedL/C/H        Extremities: Normal range of motion.  Edema: None  Mental Status: Normal mood and affect. Normal behavior. Normal judgment and thought content.   Urinalysis: Urine Protein: Negative Urine Glucose: Negative  Assessment and Plan:  Pregnancy: G2P1001 at [redacted]w[redacted]d  1. Supervision of normal pregnancy, third trimester Doing well  Term labor symptoms and general obstetric precautions including but not limited to vaginal bleeding, contractions, leaking of fluid and fetal movement were reviewed in detail with the patient. Please refer to After Visit Summary for other counseling recommendations.  Return in about 1 week (around 07/18/2015).   Lorene Dy, CNM

## 2015-07-12 LAB — OB RESULTS CONSOLE GC/CHLAMYDIA: Chlamydia: NEGATIVE

## 2015-07-17 ENCOUNTER — Inpatient Hospital Stay (HOSPITAL_COMMUNITY)
Admission: AD | Admit: 2015-07-17 | Discharge: 2015-07-17 | Disposition: A | Discharge status: Home / Self Care | Payer: Medicaid Other | Source: Ambulatory Visit | Attending: Obstetrics and Gynecology | Admitting: Obstetrics and Gynecology

## 2015-07-17 ENCOUNTER — Inpatient Hospital Stay (HOSPITAL_COMMUNITY)
Admission: AD | Admit: 2015-07-17 | Discharge: 2015-07-20 | DRG: 766 | Disposition: A | Discharge status: Home / Self Care | Payer: Medicaid Other | Source: Ambulatory Visit | Attending: Obstetrics & Gynecology | Admitting: Obstetrics & Gynecology

## 2015-07-17 ENCOUNTER — Encounter (HOSPITAL_COMMUNITY): Payer: Self-pay | Admitting: *Deleted

## 2015-07-17 DIAGNOSIS — O321XX0 Maternal care for breech presentation, not applicable or unspecified: Secondary | ICD-10-CM | POA: Diagnosis present

## 2015-07-17 DIAGNOSIS — F419 Anxiety disorder, unspecified: Secondary | ICD-10-CM | POA: Diagnosis present

## 2015-07-17 DIAGNOSIS — O99344 Other mental disorders complicating childbirth: Secondary | ICD-10-CM | POA: Diagnosis present

## 2015-07-17 DIAGNOSIS — O4593 Premature separation of placenta, unspecified, third trimester: Principal | ICD-10-CM | POA: Diagnosis present

## 2015-07-17 DIAGNOSIS — O4590 Premature separation of placenta, unspecified, unspecified trimester: Secondary | ICD-10-CM | POA: Insufficient documentation

## 2015-07-17 DIAGNOSIS — K219 Gastro-esophageal reflux disease without esophagitis: Secondary | ICD-10-CM | POA: Diagnosis present

## 2015-07-17 DIAGNOSIS — Z3493 Encounter for supervision of normal pregnancy, unspecified, third trimester: Secondary | ICD-10-CM

## 2015-07-17 DIAGNOSIS — Z833 Family history of diabetes mellitus: Secondary | ICD-10-CM

## 2015-07-17 DIAGNOSIS — O34211 Maternal care for low transverse scar from previous cesarean delivery: Secondary | ICD-10-CM | POA: Diagnosis present

## 2015-07-17 DIAGNOSIS — O34219 Maternal care for unspecified type scar from previous cesarean delivery: Secondary | ICD-10-CM

## 2015-07-17 DIAGNOSIS — Z3A38 38 weeks gestation of pregnancy: Secondary | ICD-10-CM

## 2015-07-17 DIAGNOSIS — O9962 Diseases of the digestive system complicating childbirth: Secondary | ICD-10-CM | POA: Diagnosis present

## 2015-07-17 LAB — URINALYSIS, ROUTINE W REFLEX MICROSCOPIC
BILIRUBIN URINE: NEGATIVE
Glucose, UA: NEGATIVE mg/dL
Hgb urine dipstick: NEGATIVE
KETONES UR: NEGATIVE mg/dL
Leukocytes, UA: NEGATIVE
NITRITE: NEGATIVE
PROTEIN: NEGATIVE mg/dL
Specific Gravity, Urine: 1.01 (ref 1.005–1.030)
pH: 5.5 (ref 5.0–8.0)

## 2015-07-17 NOTE — Discharge Instructions (Signed)
Braxton Hicks Contractions °Contractions of the uterus can occur throughout pregnancy. Contractions are not always a sign that you are in labor.  °WHAT ARE BRAXTON HICKS CONTRACTIONS?  °Contractions that occur before labor are called Braxton Hicks contractions, or false labor. Toward the end of pregnancy (32-34 weeks), these contractions can develop more often and may become more forceful. This is not true labor because these contractions do not result in opening (dilatation) and thinning of the cervix. They are sometimes difficult to tell apart from true labor because these contractions can be forceful and people have different pain tolerances. You should not feel embarrassed if you go to the hospital with false labor. Sometimes, the only way to tell if you are in true labor is for your health care provider to look for changes in the cervix. °If there are no prenatal problems or other health problems associated with the pregnancy, it is completely safe to be sent home with false labor and await the onset of true labor. °HOW CAN YOU TELL THE DIFFERENCE BETWEEN TRUE AND FALSE LABOR? °False Labor °· The contractions of false labor are usually shorter and not as hard as those of true labor.   °· The contractions are usually irregular.   °· The contractions are often felt in the front of the lower abdomen and in the groin.   °· The contractions may go away when you walk around or change positions while lying down.   °· The contractions get weaker and are shorter lasting as time goes on.   °· The contractions do not usually become progressively stronger, regular, and closer together as with true labor.   °True Labor °· Contractions in true labor last 30-70 seconds, become very regular, usually become more intense, and increase in frequency.   °· The contractions do not go away with walking.   °· The discomfort is usually felt in the top of the uterus and spreads to the lower abdomen and low back.   °· True labor can be  determined by your health care provider with an exam. This will show that the cervix is dilating and getting thinner.   °WHAT TO REMEMBER °· Keep up with your usual exercises and follow other instructions given by your health care provider.   °· Take medicines as directed by your health care provider.   °· Keep your regular prenatal appointments.   °· Eat and drink lightly if you think you are going into labor.   °· If Braxton Hicks contractions are making you uncomfortable:   °¨ Change your position from lying down or resting to walking, or from walking to resting.   °¨ Sit and rest in a tub of warm water.   °¨ Drink 2-3 glasses of water. Dehydration may cause these contractions.   °¨ Do slow and deep breathing several times an hour.   °WHEN SHOULD I SEEK IMMEDIATE MEDICAL CARE? °Seek immediate medical care if: °· Your contractions become stronger, more regular, and closer together.   °· You have fluid leaking or gushing from your vagina.   °· You have a fever.   °· You pass blood-tinged mucus.   °· You have vaginal bleeding.   °· You have continuous abdominal pain.   °· You have low back pain that you never had before.   °· You feel your baby's head pushing down and causing pelvic pressure.   °· Your baby is not moving as much as it used to.   °  °This information is not intended to replace advice given to you by your health care provider. Make sure you discuss any questions you have with your health care   provider.   Document Released: 08/09/2005 Document Revised: 08/14/2013 Document Reviewed: 05/21/2013 Elsevier Interactive Patient Education 2016 Elsevier Inc. [employer] of the uterus can occur throughout pregnancy. Contractions are not always a sign that you are in labor.  WHAT ARE BRAXTON HICKS CONTRACTIONS?  Contractions that occur before labor are called Braxton Hicks contractions, or false labor. Toward the end of pregnancy (32-34 weeks), these contractions can develop  more often and may become more forceful. This is not true labor because these contractions do not result in opening (dilatation) and thinning of the cervix. They are sometimes difficult to tell apart from true labor because these contractions can be forceful and people have different pain tolerances. You should not feel embarrassed if you go to the hospital with false labor. Sometimes, the only way to tell if you are in true labor is for your health care provider to look for changes in the cervix. If there are no prenatal problems or other health problems associated with the pregnancy, it is completely safe to be sent home with false labor and await the onset of true labor. HOW CAN YOU TELL THE DIFFERENCE BETWEEN TRUE AND FALSE LABOR? False Labor  The contractions of false labor are usually shorter and not as hard as those of true labor.   The contractions are usually irregular.   The contractions are often felt in the front of the lower abdomen and in the groin.   The contractions may go away when you walk around or change positions while lying down.   The contractions get weaker and are shorter lasting as time goes on.   The contractions do not usually become progressively stronger, regular, and closer together as with true labor.  True Labor  Contractions in true labor last 30-70 seconds, become very regular, usually become more intense, and increase in frequency.   The contractions do not go away with walking.   The discomfort is usually felt in the top of the uterus and spreads to the lower abdomen and low back.   True labor can be determined by your health care provider with an exam. This will show that the cervix is dilating and getting thinner.  WHAT TO REMEMBER  Keep up with your usual exercises and follow other instructions given by your health care provider.   Take medicines as directed by your health care provider.   Keep your regular prenatal appointments.    Eat and drink lightly if you think you are going into labor.   If Braxton Hicks contractions are making you uncomfortable:   Change your position from lying down or resting to walking, or from walking to resting.   Sit and rest in a tub of warm water.   Drink 2-3 glasses of water. Dehydration may cause these contractions.   Do slow and deep breathing several times an hour.  WHEN SHOULD I SEEK IMMEDIATE MEDICAL CARE? Seek immediate medical care if:  Your contractions become stronger, more regular, and closer together.   You have fluid leaking or gushing from your vagina.   You have a fever.   You pass blood-tinged mucus.   You have vaginal bleeding.   You have continuous abdominal pain.   You have low back pain that you never had before.   You feel your baby's head pushing down and causing pelvic pressure.   Your baby is not moving as much as it used to.    This information is not intended to replace advice  given to you by your health care provider. Make sure you discuss any questions you have with your health care provider.   Document Released: 08/09/2005 Document Revised: 08/14/2013 Document Reviewed: 05/21/2013 Elsevier Interactive Patient Education [employer].

## 2015-07-17 NOTE — MAU Note (Signed)
Pt states that about 20 minutes ago had a large gush of blood. Contractions every 5 mins. +FM. Previous c/s-breech presentation. Not scheduled for repeat but unsure if she wants TOLAC.

## 2015-07-17 NOTE — MAU Note (Signed)
Having contractions since 12am yesterday. No leaking or bleeding

## 2015-07-18 ENCOUNTER — Inpatient Hospital Stay (HOSPITAL_COMMUNITY): Payer: Medicaid Other | Admitting: Anesthesiology

## 2015-07-18 ENCOUNTER — Encounter (HOSPITAL_COMMUNITY): Payer: Self-pay | Admitting: Anesthesiology

## 2015-07-18 ENCOUNTER — Encounter (HOSPITAL_COMMUNITY)
Admission: AD | Disposition: A | Discharge status: Home / Self Care | Payer: Self-pay | Source: Ambulatory Visit | Attending: Obstetrics & Gynecology

## 2015-07-18 DIAGNOSIS — O9962 Diseases of the digestive system complicating childbirth: Secondary | ICD-10-CM | POA: Diagnosis present

## 2015-07-18 DIAGNOSIS — Z3A38 38 weeks gestation of pregnancy: Secondary | ICD-10-CM

## 2015-07-18 DIAGNOSIS — O99344 Other mental disorders complicating childbirth: Secondary | ICD-10-CM | POA: Diagnosis present

## 2015-07-18 DIAGNOSIS — Z833 Family history of diabetes mellitus: Secondary | ICD-10-CM | POA: Diagnosis not present

## 2015-07-18 DIAGNOSIS — O4593 Premature separation of placenta, unspecified, third trimester: Secondary | ICD-10-CM | POA: Diagnosis present

## 2015-07-18 DIAGNOSIS — O321XX0 Maternal care for breech presentation, not applicable or unspecified: Secondary | ICD-10-CM | POA: Diagnosis present

## 2015-07-18 DIAGNOSIS — F419 Anxiety disorder, unspecified: Secondary | ICD-10-CM | POA: Diagnosis present

## 2015-07-18 DIAGNOSIS — O4590 Premature separation of placenta, unspecified, unspecified trimester: Secondary | ICD-10-CM | POA: Insufficient documentation

## 2015-07-18 DIAGNOSIS — O34211 Maternal care for low transverse scar from previous cesarean delivery: Secondary | ICD-10-CM | POA: Diagnosis present

## 2015-07-18 DIAGNOSIS — K219 Gastro-esophageal reflux disease without esophagitis: Secondary | ICD-10-CM | POA: Diagnosis present

## 2015-07-18 LAB — URINALYSIS, ROUTINE W REFLEX MICROSCOPIC
Bilirubin Urine: NEGATIVE
GLUCOSE, UA: NEGATIVE mg/dL
Ketones, ur: NEGATIVE mg/dL
Nitrite: NEGATIVE
PH: 6 (ref 5.0–8.0)
Protein, ur: NEGATIVE mg/dL

## 2015-07-18 LAB — COMPREHENSIVE METABOLIC PANEL
ALBUMIN: 3.2 g/dL — AB (ref 3.5–5.0)
ALT: 13 U/L — AB (ref 14–54)
AST: 21 U/L (ref 15–41)
Alkaline Phosphatase: 171 U/L — ABNORMAL HIGH (ref 38–126)
Anion gap: 8 (ref 5–15)
BUN: 9 mg/dL (ref 6–20)
CHLORIDE: 106 mmol/L (ref 101–111)
CO2: 22 mmol/L (ref 22–32)
CREATININE: 0.77 mg/dL (ref 0.44–1.00)
Calcium: 9.6 mg/dL (ref 8.9–10.3)
GFR calc Af Amer: >60 mL/min (ref >60)
GLUCOSE: 95 mg/dL (ref 65–99)
Potassium: 4.1 mmol/L (ref 3.5–5.1)
SODIUM: 136 mmol/L (ref 135–145)
Total Bilirubin: 0.5 mg/dL (ref 0.3–1.2)
Total Protein: 6.4 g/dL — ABNORMAL LOW (ref 6.5–8.1)

## 2015-07-18 LAB — CBC
HCT: 35.7 % — ABNORMAL LOW (ref 36.0–46.0)
HEMATOCRIT: 32.2 % — AB (ref 36.0–46.0)
Hemoglobin: 11.1 g/dL — ABNORMAL LOW (ref 12.0–15.0)
Hemoglobin: 12.4 g/dL (ref 12.0–15.0)
MCH: 31.6 pg (ref 26.0–34.0)
MCH: 31.6 pg (ref 26.0–34.0)
MCHC: 34.5 g/dL (ref 30.0–36.0)
MCHC: 34.7 g/dL (ref 30.0–36.0)
MCV: 91.1 fL (ref 78.0–100.0)
MCV: 91.7 fL (ref 78.0–100.0)
PLATELETS: 189 10*3/uL (ref 150–400)
Platelets: 165 10*3/uL (ref 150–400)
RBC: 3.51 MIL/uL — ABNORMAL LOW (ref 3.87–5.11)
RBC: 3.92 MIL/uL (ref 3.87–5.11)
RDW: 13.9 % (ref 11.5–15.5)
RDW: 13.9 % (ref 11.5–15.5)
WBC: 11 10*3/uL — AB (ref 4.0–10.5)
WBC: 16.6 10*3/uL — ABNORMAL HIGH (ref 4.0–10.5)

## 2015-07-18 LAB — PROTIME-INR
INR: 1 (ref 0.00–1.49)
Prothrombin Time: 13.4 seconds (ref 11.6–15.2)

## 2015-07-18 LAB — URINE MICROSCOPIC-ADD ON

## 2015-07-18 LAB — TYPE AND SCREEN
ABO/RH(D): O POS
Antibody Screen: NEGATIVE

## 2015-07-18 LAB — RPR: RPR Ser Ql: NONREACTIVE

## 2015-07-18 SURGERY — Surgical Case
Anesthesia: Spinal

## 2015-07-18 MED ORDER — SODIUM CHLORIDE 0.9 % IJ SOLN: 3.0000 mL | INTRAMUSCULAR | Status: DC | PRN

## 2015-07-18 MED ORDER — PHENYLEPHRINE 8 MG IN D5W 100 ML (0.08MG/ML) PREMIX OPTIME
INJECTION | INTRAVENOUS | Status: AC
  Filled 2015-07-18: qty 100

## 2015-07-18 MED ORDER — NALOXONE HCL 0.4 MG/ML IJ SOLN: 0.4000 mg | INTRAMUSCULAR | Status: DC | PRN

## 2015-07-18 MED ORDER — FENTANYL CITRATE (PF) 100 MCG/2ML IJ SOLN
INTRAMUSCULAR | Status: AC
  Filled 2015-07-18: qty 2

## 2015-07-18 MED ORDER — OXYTOCIN 10 UNIT/ML IJ SOLN
40.0000 [IU] | INTRAVENOUS | Status: DC | PRN
  Administered 2015-07-18: 40 [IU] via INTRAVENOUS

## 2015-07-18 MED ORDER — PRENATAL MULTIVITAMIN CH
1.0000 | ORAL_TABLET | Freq: Every day | ORAL | Status: DC
Start: 2015-07-18 — End: 2015-07-20
  Administered 2015-07-18 – 2015-07-20 (×3): 1 via ORAL
  Filled 2015-07-18 (×3): qty 1

## 2015-07-18 MED ORDER — MORPHINE SULFATE (PF) 0.5 MG/ML IJ SOLN
INTRAMUSCULAR | Status: AC
  Filled 2015-07-18: qty 10

## 2015-07-18 MED ORDER — DIPHENHYDRAMINE HCL 25 MG PO CAPS: 25.0000 mg | ORAL_CAPSULE | Freq: Four times a day (QID) | ORAL | Status: DC | PRN

## 2015-07-18 MED ORDER — PHENYLEPHRINE 8 MG IN D5W 100 ML (0.08MG/ML) PREMIX OPTIME
INJECTION | INTRAVENOUS | Status: DC | PRN
  Administered 2015-07-18: 60 ug/min via INTRAVENOUS

## 2015-07-18 MED ORDER — ZOLPIDEM TARTRATE 5 MG PO TABS: 5.0000 mg | ORAL_TABLET | Freq: Every evening | ORAL | Status: DC | PRN

## 2015-07-18 MED ORDER — SIMETHICONE 80 MG PO CHEW
80.0000 mg | CHEWABLE_TABLET | ORAL | Status: DC | PRN
Start: 2015-07-18 — End: 2015-07-20

## 2015-07-18 MED ORDER — BUPIVACAINE IN DEXTROSE 0.75-8.25 % IT SOLN
INTRATHECAL | Status: DC | PRN
  Administered 2015-07-18: 12 mg via INTRATHECAL

## 2015-07-18 MED ORDER — NALOXONE HCL 2 MG/2ML IJ SOSY
1.0000 ug/kg/h | PREFILLED_SYRINGE | INTRAMUSCULAR | Status: DC | PRN
  Filled 2015-07-18: qty 2

## 2015-07-18 MED ORDER — ONDANSETRON HCL 4 MG/2ML IJ SOLN
INTRAMUSCULAR | Status: DC | PRN
  Administered 2015-07-18: 4 mg via INTRAVENOUS

## 2015-07-18 MED ORDER — LACTATED RINGERS IV SOLN
INTRAVENOUS | Status: DC | PRN
  Administered 2015-07-18 (×2): via INTRAVENOUS

## 2015-07-18 MED ORDER — FENTANYL CITRATE (PF) 100 MCG/2ML IJ SOLN
25.0000 ug | INTRAMUSCULAR | Status: DC | PRN
  Administered 2015-07-18: 25 ug via INTRAVENOUS

## 2015-07-18 MED ORDER — LACTATED RINGERS IV SOLN: INTRAVENOUS | Status: DC

## 2015-07-18 MED ORDER — SIMETHICONE 80 MG PO CHEW
80.0000 mg | CHEWABLE_TABLET | Freq: Three times a day (TID) | ORAL | Status: DC
Start: 2015-07-18 — End: 2015-07-20
  Administered 2015-07-18 – 2015-07-20 (×9): 80 mg via ORAL
  Filled 2015-07-18 (×9): qty 1

## 2015-07-18 MED ORDER — DEXTROSE 5 % IV SOLN
Freq: Once | INTRAVENOUS | Status: AC
  Administered 2015-07-18: 100 mL via INTRAVENOUS
  Filled 2015-07-18: qty 8.25

## 2015-07-18 MED ORDER — FENTANYL CITRATE (PF) 100 MCG/2ML IJ SOLN
INTRAMUSCULAR | Status: DC | PRN
  Administered 2015-07-18: 10 ug via INTRATHECAL

## 2015-07-18 MED ORDER — CITRIC ACID-SODIUM CITRATE 334-500 MG/5ML PO SOLN
30.0000 mL | ORAL | Status: AC
  Administered 2015-07-18: 30 mL via ORAL

## 2015-07-18 MED ORDER — ACETAMINOPHEN 325 MG PO TABS
650.0000 mg | ORAL_TABLET | ORAL | Status: DC | PRN
  Administered 2015-07-18: 650 mg via ORAL
  Filled 2015-07-18 (×2): qty 2

## 2015-07-18 MED ORDER — SCOPOLAMINE 1 MG/3DAYS TD PT72
MEDICATED_PATCH | TRANSDERMAL | Status: DC | PRN
  Administered 2015-07-18: 1 via TRANSDERMAL

## 2015-07-18 MED ORDER — MEPERIDINE HCL 25 MG/ML IJ SOLN: 6.2500 mg | INTRAMUSCULAR | Status: DC | PRN

## 2015-07-18 MED ORDER — GENTAMICIN SULFATE 40 MG/ML IJ SOLN: 5.0000 mg/kg | INTRAVENOUS | Status: DC

## 2015-07-18 MED ORDER — ONDANSETRON HCL 4 MG/2ML IJ SOLN
INTRAMUSCULAR | Status: AC
  Filled 2015-07-18: qty 2

## 2015-07-18 MED ORDER — OXYTOCIN 10 UNIT/ML IJ SOLN
INTRAMUSCULAR | Status: AC
  Filled 2015-07-18: qty 4

## 2015-07-18 MED ORDER — MORPHINE SULFATE (PF) 0.5 MG/ML IJ SOLN
INTRAMUSCULAR | Status: DC | PRN
  Administered 2015-07-18: .2 mg via INTRATHECAL

## 2015-07-18 MED ORDER — KETOROLAC TROMETHAMINE 30 MG/ML IJ SOLN
30.0000 mg | Freq: Four times a day (QID) | INTRAMUSCULAR | Status: AC | PRN
  Administered 2015-07-18: 30 mg via INTRAVENOUS
  Filled 2015-07-18: qty 1

## 2015-07-18 MED ORDER — DEXAMETHASONE SODIUM PHOSPHATE 10 MG/ML IJ SOLN
INTRAMUSCULAR | Status: AC
  Filled 2015-07-18: qty 1

## 2015-07-18 MED ORDER — KETOROLAC TROMETHAMINE 30 MG/ML IJ SOLN: 30.0000 mg | Freq: Four times a day (QID) | INTRAMUSCULAR | Status: AC | PRN

## 2015-07-18 MED ORDER — MENTHOL 3 MG MT LOZG: 1.0000 | LOZENGE | OROMUCOSAL | Status: DC | PRN

## 2015-07-18 MED ORDER — DIPHENHYDRAMINE HCL 25 MG PO CAPS
25.0000 mg | ORAL_CAPSULE | ORAL | Status: DC | PRN
  Administered 2015-07-18 – 2015-07-19 (×2): 25 mg via ORAL
  Filled 2015-07-18 (×2): qty 1

## 2015-07-18 MED ORDER — NALBUPHINE HCL 10 MG/ML IJ SOLN: 5.0000 mg | Freq: Once | INTRAMUSCULAR | Status: DC | PRN

## 2015-07-18 MED ORDER — CITRIC ACID-SODIUM CITRATE 334-500 MG/5ML PO SOLN
ORAL | Status: AC
  Administered 2015-07-18: 30 mL via ORAL
  Filled 2015-07-18: qty 15

## 2015-07-18 MED ORDER — FAMOTIDINE IN NACL 20-0.9 MG/50ML-% IV SOLN
INTRAVENOUS | Status: AC
  Administered 2015-07-18: 20 mg via INTRAVENOUS
  Filled 2015-07-18: qty 50

## 2015-07-18 MED ORDER — LACTATED RINGERS IV SOLN
INTRAVENOUS | Status: DC | PRN
  Administered 2015-07-18: 01:00:00 via INTRAVENOUS

## 2015-07-18 MED ORDER — LANOLIN HYDROUS EX OINT: 1.0000 "application " | TOPICAL_OINTMENT | CUTANEOUS | Status: DC | PRN

## 2015-07-18 MED ORDER — WITCH HAZEL-GLYCERIN EX PADS: 1.0000 "application " | MEDICATED_PAD | CUTANEOUS | Status: DC | PRN

## 2015-07-18 MED ORDER — DIPHENHYDRAMINE HCL 50 MG/ML IJ SOLN: 12.5000 mg | INTRAMUSCULAR | Status: DC | PRN

## 2015-07-18 MED ORDER — IBUPROFEN 600 MG PO TABS
600.0000 mg | ORAL_TABLET | Freq: Four times a day (QID) | ORAL | Status: DC
  Administered 2015-07-18 – 2015-07-20 (×10): 600 mg via ORAL
  Filled 2015-07-18 (×10): qty 1

## 2015-07-18 MED ORDER — SCOPOLAMINE 1 MG/3DAYS TD PT72
MEDICATED_PATCH | TRANSDERMAL | Status: AC
  Filled 2015-07-18: qty 1

## 2015-07-18 MED ORDER — OXYCODONE-ACETAMINOPHEN 5-325 MG PO TABS
1.0000 | ORAL_TABLET | ORAL | Status: DC | PRN
  Administered 2015-07-18: 1 via ORAL
  Filled 2015-07-18: qty 1

## 2015-07-18 MED ORDER — NALBUPHINE HCL 10 MG/ML IJ SOLN: 5.0000 mg | INTRAMUSCULAR | Status: DC | PRN

## 2015-07-18 MED ORDER — DEXAMETHASONE SODIUM PHOSPHATE 10 MG/ML IJ SOLN
INTRAMUSCULAR | Status: DC | PRN
  Administered 2015-07-18: 10 mg via INTRAVENOUS

## 2015-07-18 MED ORDER — DIBUCAINE 1 % RE OINT: 1.0000 "application " | TOPICAL_OINTMENT | RECTAL | Status: DC | PRN

## 2015-07-18 MED ORDER — SIMETHICONE 80 MG PO CHEW
80.0000 mg | CHEWABLE_TABLET | ORAL | Status: DC
  Administered 2015-07-18 – 2015-07-19 (×2): 80 mg via ORAL
  Filled 2015-07-18 (×2): qty 1

## 2015-07-18 MED ORDER — OXYTOCIN 40 UNITS IN LACTATED RINGERS INFUSION - SIMPLE MED: 62.5000 mL/h | INTRAVENOUS | Status: AC

## 2015-07-18 MED ORDER — CLINDAMYCIN PHOSPHATE 900 MG/50ML IV SOLN: 900.0000 mg | INTRAVENOUS | Status: DC

## 2015-07-18 MED ORDER — OXYCODONE-ACETAMINOPHEN 5-325 MG PO TABS
2.0000 | ORAL_TABLET | ORAL | Status: DC | PRN
  Administered 2015-07-18 – 2015-07-20 (×11): 2 via ORAL
  Filled 2015-07-18 (×11): qty 2

## 2015-07-18 MED ORDER — SENNOSIDES-DOCUSATE SODIUM 8.6-50 MG PO TABS
2.0000 | ORAL_TABLET | ORAL | Status: DC
  Administered 2015-07-18 – 2015-07-19 (×2): 2 via ORAL
  Filled 2015-07-18 (×2): qty 2

## 2015-07-18 MED ORDER — ONDANSETRON HCL 4 MG/2ML IJ SOLN: 4.0000 mg | Freq: Three times a day (TID) | INTRAMUSCULAR | Status: DC | PRN

## 2015-07-18 MED ORDER — PROMETHAZINE HCL 25 MG/ML IJ SOLN: 6.2500 mg | INTRAMUSCULAR | Status: DC | PRN

## 2015-07-18 MED ORDER — FAMOTIDINE IN NACL 20-0.9 MG/50ML-% IV SOLN
20.0000 mg | Freq: Once | INTRAVENOUS | Status: AC
  Administered 2015-07-18: 20 mg via INTRAVENOUS

## 2015-07-18 MED ORDER — TETANUS-DIPHTH-ACELL PERTUSSIS 5-2.5-18.5 LF-MCG/0.5 IM SUSP: 0.5000 mL | Freq: Once | INTRAMUSCULAR | Status: DC

## 2015-07-18 MED ORDER — SCOPOLAMINE 1 MG/3DAYS TD PT72
1.0000 | MEDICATED_PATCH | Freq: Once | TRANSDERMAL | Status: DC
Start: 2015-07-18 — End: 2015-07-20
  Filled 2015-07-18: qty 1

## 2015-07-18 MED ORDER — CITRIC ACID-SODIUM CITRATE 334-500 MG/5ML PO SOLN: 30.0000 mL | Freq: Once | ORAL | Status: DC

## 2015-07-18 SURGICAL SUPPLY — 30 items
BARRIER ADHS 3X4 INTERCEED (GAUZE/BANDAGES/DRESSINGS) IMPLANT
CLAMP CORD UMBIL (MISCELLANEOUS) IMPLANT
CLOTH BEACON ORANGE TIMEOUT ST (SAFETY) ×3 IMPLANT
DRAPE SHEET LG 3/4 BI-LAMINATE (DRAPES) IMPLANT
DRSG OPSITE POSTOP 4X10 (GAUZE/BANDAGES/DRESSINGS) ×3 IMPLANT
DURAPREP 26ML APPLICATOR (WOUND CARE) ×3 IMPLANT
ELECT REM PT RETURN 9FT ADLT (ELECTROSURGICAL) ×3
ELECTRODE REM PT RTRN 9FT ADLT (ELECTROSURGICAL) ×1 IMPLANT
EXTRACTOR VACUUM KIWI (MISCELLANEOUS) IMPLANT
GLOVE BIO SURGEON STRL SZ 6.5 (GLOVE) ×2 IMPLANT
GLOVE BIO SURGEONS STRL SZ 6.5 (GLOVE) ×1
GLOVE BIOGEL PI IND STRL 7.0 (GLOVE) ×2 IMPLANT
GLOVE BIOGEL PI INDICATOR 7.0 (GLOVE) ×4
GOWN STRL REUS W/TWL LRG LVL3 (GOWN DISPOSABLE) ×6 IMPLANT
KIT ABG SYR 3ML LUER SLIP (SYRINGE) IMPLANT
NEEDLE HYPO 22GX1.5 SAFETY (NEEDLE) IMPLANT
NEEDLE HYPO 25X5/8 SAFETYGLIDE (NEEDLE) IMPLANT
NS IRRIG 1000ML POUR BTL (IV SOLUTION) ×3 IMPLANT
PACK C SECTION WH (CUSTOM PROCEDURE TRAY) ×3 IMPLANT
PAD OB MATERNITY 4.3X12.25 (PERSONAL CARE ITEMS) ×3 IMPLANT
PENCIL SMOKE EVAC W/HOLSTER (ELECTROSURGICAL) ×3 IMPLANT
RETRACTOR WND ALEXIS 25 LRG (MISCELLANEOUS) IMPLANT
RTRCTR WOUND ALEXIS 25CM LRG (MISCELLANEOUS)
SUT VIC AB 0 CT1 36 (SUTURE) ×18 IMPLANT
SUT VIC AB 2-0 CT1 27 (SUTURE) ×2
SUT VIC AB 2-0 CT1 TAPERPNT 27 (SUTURE) ×1 IMPLANT
SUT VIC AB 4-0 PS2 27 (SUTURE) ×3 IMPLANT
SYR CONTROL 10ML LL (SYRINGE) IMPLANT
TOWEL OR 17X24 6PK STRL BLUE (TOWEL DISPOSABLE) ×3 IMPLANT
TRAY FOLEY CATH SILVER 14FR (SET/KITS/TRAYS/PACK) IMPLANT

## 2015-07-18 NOTE — Anesthesia Postprocedure Evaluation (Signed)
Anesthesia Post Note  Patient: Kim [name]  Procedure(s) Performed: Procedure(s) (LRB): CESAREAN SECTION (N/A)  Patient location during evaluation: PACU Anesthesia Type: Spinal Level of consciousness: oriented and awake and alert Pain management: pain level controlled Vital Signs Assessment: post-procedure vital signs reviewed and stable Respiratory status: spontaneous breathing, respiratory function stable and patient connected to nasal cannula oxygen Cardiovascular status: blood pressure returned to baseline and stable Postop Assessment: No backache and No signs of nausea or vomiting Anesthetic complications: no    Last Vitals:  Filed Vitals:   07/18/15 0215 07/18/15 0230  BP: 124/63 121/76  Pulse:    Temp:  37.4 C  Resp:      Last Pain:  Filed Vitals:   07/18/15 0243  PainSc: 0-No pain    LLE Motor Response: Purposeful movement LLE Sensation: Tingling RLE Motor Response: Purposeful movement RLE Sensation: Tingling      Effie Berkshire

## 2015-07-18 NOTE — Lactation Note (Signed)
This note was copied from the chart of Kim [name]. Lactation Consultation Note Follow up visit at 20 hours of age.  [place] visited earlier today with [facility] resouces given and discussed.  Baby has started single phototherapy.  Mom has baby latched swaddled with phototherapy and complains of pain with latch.  Rolled lower lip out with improved comfort.  Then assisted mom with allowing baby to go STS for feeding for closer deeper latch and then place light behind baby once latched well to breast.  Mom reports much improvement with comfort and appreciated assistance.  Baby latched well, but sleepy mom is stimulating baby to  Keep awake for feeding.  Discussed basic with supply and demand.  Encouraged mom to post pump for 15 minutes and work on hand expression, mom to spoon feed EBM to baby as well.  Mom to call for assist as needed.    Patient Name: Kim [name] [id number] Date: 07/18/2015 Reason for consult: Follow-up assessment;Initial assessment;Hyperbilirubinemia   Maternal Data    Feeding Feeding Type: Breast Fed Length of feed:  (several minutes observed)  LATCH Score/Interventions Latch: Repeated attempts needed to sustain latch, nipple held in mouth throughout feeding, stimulation needed to elicit sucking reflex. Intervention(s): Adjust position;Assist with latch;Breast massage;Breast compression  Audible Swallowing: A few with stimulation  Type of Nipple: Everted at rest and after stimulation Intervention(s): Double electric pump  Comfort (Breast/Nipple): Filling, red/small blisters or bruises, mild/mod discomfort  Problem noted: Mild/Moderate discomfort Interventions (Mild/moderate discomfort): Post-pump;Hand expression  Hold (Positioning): Assistance needed to correctly position infant at breast and maintain latch. Intervention(s): Breastfeeding basics reviewed;Support Pillows;Position options;Skin to skin  LATCH Score: 6  Lactation Tools Discussed/Used     Consult  Status Consult Status: Follow-up Date: 07/19/15 Follow-up type: In-patient    Kendell Bane Justine Null 07/18/2015, 9:56 PM

## 2015-07-18 NOTE — Anesthesia Procedure Notes (Signed)
Spinal Patient location during procedure: OR Start time: 07/18/2015 12:49 AM End time: 07/18/2015 12:51 AM Staffing Anesthesiologist: Suella Broad D Performed by: anesthesiologist  Preanesthetic Checklist Completed: patient identified, site marked, surgical consent, pre-op evaluation, timeout performed, IV checked, risks and benefits discussed and monitors and equipment checked Spinal Block Patient position: sitting Prep: DuraPrep Patient monitoring: heart rate, continuous pulse ox, blood pressure and cardiac monitor Approach: midline Location: L4-5 Injection technique: single-shot Needle Needle type: Whitacre and Introducer  Needle gauge: 24 G Needle length: 9 cm Additional Notes Brief paresthesia with spinal needle, quickly resolved. Slight readjustment, no paresthesia per patient. Negative blood return. Positive free-flowing CSF. Expiration date of kit checked and confirmed. Patient tolerated procedure well, without complications.

## 2015-07-18 NOTE — Anesthesia Preprocedure Evaluation (Addendum)
Anesthesia Evaluation  Patient identified by MRN, date of birth, ID band Patient awake    Reviewed: Allergy & Precautions, NPO status , Patient's Chart, lab work & pertinent test results  Airway Mallampati: II  TM Distance: >3 FB Neck ROM: Full    Dental  (+) Teeth Intact   Pulmonary neg pulmonary ROS,    breath sounds clear to auscultation       Cardiovascular negative cardio ROS   Rhythm:Regular Rate:Normal     Neuro/Psych PSYCHIATRIC DISORDERS Anxiety negative neurological ROS     GI/Hepatic Neg liver ROS, GERD  ,  Endo/Other  negative endocrine ROS  Renal/GU negative Renal ROS  negative genitourinary   Musculoskeletal negative musculoskeletal ROS (+)   Abdominal   Peds negative pediatric ROS (+)  Hematology negative hematology ROS (+)   Anesthesia Other Findings   Reproductive/Obstetrics (+) Pregnancy                            Lab Results  Component Value Date   WBC 9.9 05/16/2015   HGB 12.8 05/16/2015   HCT 38.3 05/16/2015   MCV 94.3 05/16/2015   PLT 215 05/16/2015   No results found for: CREATININE, BUN, NA, K, CL, CO2 No results found for: INR, PROTIME   Anesthesia Physical Anesthesia Plan  ASA: II and emergent  Anesthesia Plan: Spinal   Post-op Pain Management:    Induction:   Airway Management Planned: Natural Airway  Additional Equipment:   Intra-op Plan:   Post-operative Plan:   Informed Consent: I have reviewed the patients History and Physical, chart, labs and discussed the procedure including the risks, benefits and alternatives for the proposed anesthesia with the patient or authorized representative who has indicated his/her understanding and acceptance.   Dental advisory given  Plan Discussed with: CRNA  Anesthesia Plan Comments:         Anesthesia Quick Evaluation

## 2015-07-18 NOTE — Transfer of Care (Signed)
Immediate Anesthesia Transfer of Care Note  Patient: Kim [name]  Procedure(s) Performed: Procedure(s): CESAREAN SECTION (N/A)  Patient Location: PACU  Anesthesia Type:Spinal  Level of Consciousness: awake, alert , oriented and patient cooperative  Airway & Oxygen Therapy: Patient Spontanous Breathing  Post-op Assessment: Report given to RN and Post -op Vital signs reviewed and stable  Post vital signs: Reviewed and stable  Last Vitals:  Filed Vitals:   07/17/15 2352  BP: 130/87  Pulse: 85  Temp: 36.6 C  Resp: 18    Complications: No apparent anesthesia complications

## 2015-07-18 NOTE — H&P (Signed)
None     Chief Complaint:  Vaginal Bleeding   Kim [name] is  29 y.o. G2P1001 at [redacted]w[redacted]d presents complaining of Vaginal Bleeding .  She states regular, every 4 minutes contractions are associated with heavy vaginal bleeding, intact membranes, along with active fetal movement. Pt. States she was laying down in bed to go to sleep whenever she began to feel a large amount of fluid around her legs. She got up and noticed a significant amount of blood flowing from her vaginal area. She went to the bathroom and stood in the tub where she continued to bleed. She then came to the hospital for evaluation. She additionally began having contractions q4 minutes which are uncomfortable for her. She denies frank abdominal pain associated with the bleeding, nausea, vomiting, fever, chills. No abdominal trauma. Low risk pregnancy to this point. Seen in [place]. Girl / Breast / Will discuss contraception. GBS negative. History of previous LTCS.  9 W U/S - 51%, Anterior placenta, Normal anatomy.    Obstetrical/Gynecological History: OB History    Gravida Para Term Preterm AB TAB SAB Ectopic Multiple Living   2 1 1       1       Obstetric Comments   Kim [name]     Past Medical History: Past Medical History  Diagnosis Date  . Fibroid     1.9cm  . 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 Anxiety   . GERD (gastroesophageal reflux disease)   . Vaginal Pap smear, abnormal   . HPV (human papilloma virus) infection     Past Surgical History: Past Surgical History  Procedure Laterality Date  . No past surgeries    . Cesarean section N/A 06/07/2013    Procedure: PRIMARY CESAREAN SECTION;  Surgeon: Shelly Bombard, MD;  Location: [facility];  Service: Obstetrics;  Laterality: N/A;    Family History: Family History  Problem Relation Age of Onset  . Diabetes Father   . Diabetes Maternal Grandmother     Social History: Social History  Substance Use Topics  . Smoking status: Never Smoker   .  Smokeless tobacco: Never Used  . Alcohol Use: No     Comment: rare  none  while  preg    Allergies:  Allergies  Allergen Reactions  . Naproxen Anaphylaxis    Can take ibuprofen  . Penicillins Other (See Comments)    Family history of allergy, patient prefers to not take this medication    Meds:  Prescriptions prior to admission  Medication Sig Dispense Refill Last Dose  . Prenatal MV-Min-Fe Fum-FA-DHA (PRENATAL 1 PO) Take by mouth.   Past Week at Unknown time    Review of Systems -   Review of Systems  Constitutional: Negative for fever, chills, weight loss, malaise/fatigue and diaphoresis.  HENT: Negative for hearing loss, ear pain, nosebleeds, congestion, sore throat, neck pain, tinnitus and ear discharge.   Eyes: Negative for blurred vision, double vision, photophobia, pain, discharge and redness.  Respiratory: Negative for cough, hemoptysis, sputum production, shortness of breath, wheezing and stridor.   Cardiovascular: Negative for chest pain, palpitations, orthopnea,  leg swelling  Gastrointestinal: Negative for abdominal pain heartburn, nausea, vomiting, diarrhea, constipation, blood in stool Genitourinary: Negative for dysuria, urgency, frequency, hematuria and flank pain.  Musculoskeletal: Negative for myalgias, back pain, joint pain and falls.  Skin: Negative for itching and rash.  Neurological: Negative for dizziness, tingling, tremors, sensory change, speech change, focal weakness, seizures, loss of consciousness, weakness and headaches.  Endo/Heme/Allergies:  Negative for environmental allergies and polydipsia. Does not bruise/bleed easily.  Psychiatric/Behavioral: Negative for depression, suicidal ideas, hallucinations, memory loss and substance abuse. The patient is not nervous/anxious and does not have insomnia.      Physical Exam  Blood pressure 130/87, pulse 85, temperature 97.9 F (36.6 C), temperature source Oral, resp. rate 18, height 5\' 3"  (1.6 m), weight  84.55 kg (186 lb 6.4 oz), last menstrual period 09/27/2014. GENERAL: Well-developed, well-nourished female in no acute distress.  LUNGS: Clear to auscultation bilaterally.  HEART: Regular rate and rhythm. ABDOMEN: Soft, nontender, nondistended, gravid.  EXTREMITIES: Nontender, no edema, 2+ distal pulses. DTR's 2+ CERVICAL EXAM: Dilatation 0cm   Effacement 80%   Station -1   Presentation: cephalic FHT:  Baseline rate 150 bpm   Variability moderate  Accelerations present   Decelerations none Contractions: Every 4 mins   Labs: Results for orders placed or performed during the hospital encounter of 07/17/15 (from the past 24 hour(s))  Urinalysis, Routine w reflex microscopic (not at [facility])   Collection Time: 07/17/15  3:53 AM  Result Value Ref Range   Color, Urine YELLOW YELLOW   APPearance CLEAR CLEAR   Specific Gravity, Urine 1.010 1.005 - 1.030   pH 5.5 5.0 - 8.0   Glucose, UA NEGATIVE NEGATIVE mg/dL   Hgb urine dipstick NEGATIVE NEGATIVE   Bilirubin Urine NEGATIVE NEGATIVE   Ketones, ur NEGATIVE NEGATIVE mg/dL   Protein, ur NEGATIVE NEGATIVE mg/dL   Nitrite NEGATIVE NEGATIVE   Leukocytes, UA NEGATIVE NEGATIVE   Imaging Studies:  No results found.  Assessment: Kim [name] is  29 y.o. G2P1001 at [redacted]w[redacted]d presents with likely placental abruption given bleeding and contraction pattern. Previous cesarean delivery for frank breech.    Plan: - Admit for RLTCS - CBC, CMP - Continuous fetal monitoring.  - Routine cesarean orders.  - type and screen.  - RPR  Kim [name] 11/25/201612:07 AM

## 2015-07-18 NOTE — Consult Note (Signed)
[facility]  Delivery Note:  C-section       07/18/2015  1:02 AM  I was called to the operating room at the request of the patient's obstetrician (Dr. Roselie Awkward) for a repeat c-section.  PRENATAL HX:  This is a 29 y/o G2P1001 at 41 and 3/[redacted] weeks GA who was admitted for vaginal bleeding and likely placental abruption.  She had a previous c-section for frank breech so was taken for repeat c-section.  Terminal meconium noted along with single nuchal cord.   DELIVERY:  Infant was vigorous at delivery, requiring no resuscitation other than standard warming, drying and stimulation.  APGARs 7 and 8.  Still cyanotic at 5 minutes of age so pulse oximter applied.  Oxygen saturation in mid 80's and rose to low 90s by 10 minutes of age.  Exam notable for bruising along left arm and shoulder, otherwise within normal limits.  After 10 minutes, baby left with nurse to assist parents with skin-to-skin care.   _____________________ Electronically Signed By: Clinton Gallant, MD Neonatologist

## 2015-07-18 NOTE — Op Note (Signed)
Cesarean Section Procedure Note   Belky Coltharp  07/17/2015 - 07/18/2015  Indications: vaginal bleeding placental abruption   Pre-operative Diagnosis: Previous Cesarean Section, vaginal bleeding.   Post-operative Diagnosis: Same   Surgeon: Surgeon(s) and Role:    * Woodroe Mode, MD - Primary   Assistants: none  Anesthesia: spinal   Procedure Details:  The patient was seen in the Holding Room. The risks, benefits, complications, treatment options, and expected outcomes were discussed with the patient. The patient concurred with the proposed plan, giving informed consent. identified as [name] and the procedure verified as C-Section Delivery. A Time Out was held and the above information confirmed.  After induction of anesthesia, the patient was draped and prepped in the usual sterile manner. A transverse was made and carried down through the subcutaneous tissue to the fascia. Fascial incision was made and extended transversely. The fascia was separated from the underlying rectus tissue superiorly and inferiorly. The peritoneum was identified and entered. Peritoneal incision was extended longitudinally. The utero-vesical peritoneal reflection was incised transversely and the bladder flap was bluntly freed from the lower uterine segment. A low transverse uterine incision was made. Delivered from cephalic presentation was a  Living newborn infant(s) or female with Apgar scores of 7 at one minute and 9 at five minutes. Cord ph was sent the umbilical cord was clamped and cut cord blood was obtained for evaluation. The placenta was removed Intact and appeared abnormal - with clot . The uterine outline, tubes and ovaries appeared normal}. The uterine incision was closed with running locked sutures of 0Vicryl. Imbricating layer followed  Hemostasis was observed.. The peritoneum was closed with 2-0 Vicryl.The fascia was then reapproximated with running sutures of 0Vicryl. The subcuticular closure  was performed using 4-0Vicryl.   Instrument, sponge, and needle counts were correct prior the abdominal closure and were correct at the conclusion of the case.    Findings:   Estimated Blood Loss: 600 ml  Total IV Fluids: 1500 ml   Urine Output: 50CC OF clear urine  Specimens: @ORSPECIMEN @   Complications: no complications  Disposition: PACU - hemodynamically stable.   Maternal Condition: stable   Baby condition / location:  Couplet care / Skin to Skin  Attending Attestation: I was present and scrubbed for the entire procedure.   Signed: Surgeon(s): Woodroe Mode, MD 07/18/2015 1:50 AM

## 2015-07-19 NOTE — Lactation Note (Signed)
This note was copied from the chart of Kim Bricelyn Feist. Lactation Consultation Note MD would like LC to work with mom w/latching. Encouraged to call for next feeding. Has DEBP at home. Hand expressed easy flow of colostrum. Mom looks slightly bruised in center of nipples, no stripes. CG given. Patient Name: Kim [name] [id number] Date: 07/19/2015     Maternal Data    Feeding Feeding Type: Formula (Mother does not want to latch at this time, visitors present) Length of feed: 45 min  LATCH Score/Interventions                      Lactation Tools Discussed/Used     Consult Status      Oralia Criger G 07/19/2015, 11:22 AM

## 2015-07-19 NOTE — Progress Notes (Signed)
  Post Partum Day 1 Subjective: no complaints, up ad lib, voiding, tolerating PO, + flatus and little incision site pain, minimal bleeding, up and moving around, passing gas, tolerating po, thinking about contraception.   Objective: Blood pressure 118/58, pulse 77, temperature 98.2 F (36.8 C), temperature source Oral, resp. rate 17, height 5\' 3"  (1.6 m), weight 84.55 kg (186 lb 6.4 oz), last menstrual period 09/27/2014, SpO2 98 %, unknown if currently breastfeeding.  Physical Exam:  General: alert, cooperative and no distress Lochia: appropriate Uterine Fundus: firm Incision: healing well, no significant drainage, no dehiscence, no significant erythema DVT Evaluation: No evidence of DVT seen on physical exam.   Recent Labs  07/18/15 0010 07/18/15 0540  HGB 12.4 11.1*  HCT 35.7* 32.2*    Assessment/Plan: Breastfeeding and Lactation consult  29 y/o G2P2002 s/p RLTCS doing well.  - Continue current care - Thinking about Contraception options.  - Will discuss again tomorrow.     LOS: 1 day   Kim [name] 07/19/2015, 7:40 AM

## 2015-07-19 NOTE — Lactation Note (Signed)
This note was copied from the chart of Girl Maire Rzepecki. Lactation Consultation Note Follow up visit at 50 hours of age. Mom reports just finishing a feedings. LC was unable to see baby latched.  Baby now asleep in crib and now on double phototherpy.  Assisted with set up of DEBP already in room and mom has easily expressed colostrum.  Explained need to supplement for jaundice baby per Dr. Kayleen Memos.  Encouraged mom to pump to offer EBM before formula.  Mom reports she is doing well, but FOB is concerned and all questions were answered.    Mom to feed baby 8-12 times daily and post pump for 15 minutes with hand expression.  FOB to supplement with EBM and formula if needed.  Mom is using #27 flange on left breast and #24 on right. Mom has been using comfort gels also.    Patient Name: Girl Alia Neiss [id number] Date: 07/19/2015 Reason for consult: Follow-up assessment;Breast/nipple pain;Infant weight loss;Hyperbilirubinemia   Maternal Data Has patient been taught Hand Expression?: Yes  Feeding Feeding Type: Breast Fed Length of feed: 15 min  LATCH Score/Interventions Latch: Repeated attempts needed to sustain latch, nipple held in mouth throughout feeding, stimulation needed to elicit sucking reflex.  Audible Swallowing: A few with stimulation  Type of Nipple: Everted at rest and after stimulation  Comfort (Breast/Nipple): Soft / non-tender (denies discomfort at this time)  Interventions (Mild/moderate discomfort): Comfort gels  Hold (Positioning): No assistance needed to correctly position infant at breast. Intervention(s): Breastfeeding basics reviewed  LATCH Score: 8  Lactation Tools Discussed/Used Pump Review: Setup, frequency, and cleaning   Consult Status Consult Status: Follow-up Date: 07/20/15 Follow-up type: In-patient    Justice Britain 07/19/2015, 6:44 PM

## 2015-07-20 MED ORDER — OXYCODONE-ACETAMINOPHEN 5-325 MG PO TABS: 1.0000 | ORAL_TABLET | ORAL | Status: DC | PRN

## 2015-07-20 NOTE — Discharge Instructions (Signed)
Cesarean Delivery, Care After  Refer to this sheet in the next few weeks. These instructions provide you with information on caring for yourself after your procedure. Your health care provider may also give you specific instructions. Your treatment has been planned according to current medical practices, but problems sometimes occur. Call your health care provider if you have any problems or questions after you go home.  HOME CARE INSTRUCTIONS   Only take over-the-counter or prescription medications as directed by your health care provider.   Do not drink alcohol, especially if you are breastfeeding or taking medication to relieve pain.   Do not chew or smoke tobacco.   Continue to use good perineal care. Good perineal care includes:    Wiping your perineum from front to back.    Keeping your perineum clean.   Check your surgical cut (incision) daily for increased redness, drainage, swelling, or separation of skin.   Clean your incision gently with soap and water every day, and then pat it dry. If your health care provider says it is okay, leave the incision uncovered. Use a bandage (dressing) if the incision is draining fluid or appears irritated. If the adhesive strips across the incision do not fall off within 7 days, carefully peel them off.   Hug a pillow when coughing or sneezing until your incision is healed. This helps to relieve pain.   Do not use tampons or douche until your health care provider says it is okay.   Shower, wash your hair, and take tub baths as directed by your health care provider.   Wear a well-fitting bra that provides breast support.   Limit wearing support panties or control-top hose.   Drink enough fluids to keep your urine clear or pale yellow.   Eat high-fiber foods such as whole grain cereals and breads, brown rice, beans, and fresh fruits and vegetables every day. These foods may help prevent or relieve constipation.   Resume activities such as climbing stairs,  driving, lifting, exercising, or traveling as directed by your health care provider.   Talk to your health care provider about resuming sexual activities. This is dependent upon your risk of infection, your rate of healing, and your comfort and desire to resume sexual activity.   Try to have someone help you with your household activities and your newborn for at least a few days after you leave the hospital.   Rest as much as possible. Try to rest or take a nap when your newborn is sleeping.   Increase your activities gradually.   Keep all of your scheduled postpartum appointments. It is very important to keep your scheduled follow-up appointments. At these appointments, your health care provider will be checking to make sure that you are healing physically and emotionally.  SEEK MEDICAL CARE IF:    You are passing large clots from your vagina. Save any clots to show your health care provider.   You have a foul smelling discharge from your vagina.   You have trouble urinating.   You are urinating frequently.   You have pain when you urinate.   You have a change in your bowel movements.   You have increasing redness, pain, or swelling near your incision.   You have pus draining from your incision.   Your incision is separating.   You have painful, hard, or reddened breasts.   You have a severe headache.   You have blurred vision or see spots.   You feel sad   or depressed.   You have thoughts of hurting yourself or your newborn.   You have questions about your care, the care of your newborn, or medications.   You are dizzy or light-headed.   You have a rash.   You have pain, redness, or swelling at the site of the removed intravenous access (IV) tube.   You have nausea or vomiting.   You stopped breastfeeding and have not had a menstrual period within 12 weeks of stopping.   You are not breastfeeding and have not had a menstrual period within 12 weeks of delivery.   You have a fever.  SEEK  IMMEDIATE MEDICAL CARE IF:   You have persistent pain.   You have chest pain.   You have shortness of breath.   You faint.   You have leg pain.   You have stomach pain.   Your vaginal bleeding saturates 2 or more sanitary pads in 1 hour.  MAKE SURE YOU:    Understand these instructions.   Will watch your condition.   Will get help right away if you are not doing well or get worse.     This information is not intended to replace advice given to you by your health care provider. Make sure you discuss any questions you have with your health care provider.     Document Released: 05/01/2002 Document Revised: 08/30/2014 Document Reviewed: 04/05/2012  Elsevier Interactive Patient Education 2016 [employer]

## 2015-07-20 NOTE — Lactation Note (Signed)
This note was copied from the chart of Kim Therma Righter. Lactation Consultation Note  Patient Name: Kim [name] [id number] Date: 07/20/2015 Reason for consult: Follow-up assessment Mom states that she is making more milk today but her nipples are very sore. No redness or skin break down noted. She has comfort gels and is using her milk on her nipple after each feeding. She stated that she is thinking about pump to feed only and has started taking Fenugreek this am. Suggested that mom get help at next feeding to latch baby and try a combination of latching and pumping before giving up on latch. Discussed breast changes, milk transition, and giving her body time to work. She is aware of OP services and support group.    Maternal Data    Feeding    LATCH Score/Interventions                      Lactation Tools Discussed/Used     Consult Status Consult Status: Follow-up Date: 07/20/15 Follow-up type: In-patient    Denzil Hughes 07/20/2015, 2:25 PM

## 2015-07-20 NOTE — Discharge Summary (Signed)
OB Discharge Summary     Patient Name: Kim [name] DOB: 06-Aug-1986 MRN: [id number]  Date of admission: 07/17/2015 Delivering MD: Woodroe Mode   Date of discharge: 07/20/2015  Admitting diagnosis: 38 WKS, BLEEDING Intrauterine pregnancy: [redacted]w[redacted]d     Secondary diagnosis:  Active Problems:   Placental abruption  Additional problems: none     Discharge diagnosis: Term Pregnancy Delivered and placental abruption                                                                                                Post partum procedures:none  Augmentation: none  Complications: Placental Abruption  Hospital course:  Onset of Labor With Unplanned C/S  29 y.o. yo G2P2002 at [redacted]w[redacted]d was admitted in [place] on 07/17/2015. Patient had insignificant labor course with presentation primarily for bleeding. Membrane Rupture Time/Date: 1:04 AM ,07/18/2015   The patient went for cesarean section due to placental abruption, and delivered a Viable infant,07/18/2015  Details of operation can be found in separate operative note. Patient had an uncomplicated postpartum course.  She is ambulating,tolerating a regular diet, passing flatus, and urinating well.  Patient is discharged home in stable condition 07/20/15.   Physical exam  Filed Vitals:   07/18/15 2319 07/19/15 0639 07/19/15 1850 07/20/15 0540  BP: 119/53 118/58 106/59 100/61  Pulse: 78 77 75 71  Temp: 98.2 F (36.8 C) 98.2 F (36.8 C) 98 F (36.7 C) 98 F (36.7 C)  TempSrc: Oral Oral Oral Oral  Resp: 18 17 18 18   Height:      Weight:      SpO2: 100% 98%     General: alert, cooperative and no distress Lochia: appropriate Uterine Fundus: firm Incision: Healing well with no significant drainage, No significant erythema, Dressing is clean, dry, and intact DVT Evaluation: No evidence of DVT seen on physical exam. Labs: Lab Results  Component Value Date   WBC 16.6* 07/18/2015   HGB 11.1* 07/18/2015   HCT 32.2* 07/18/2015   MCV  91.7 07/18/2015   PLT 165 07/18/2015   CMP Latest Ref Rng 07/18/2015  Glucose 65 - 99 mg/dL 95  BUN 6 - 20 mg/dL 9  Creatinine 0.44 - 1.00 mg/dL 0.77  Sodium 135 - 145 mmol/L 136  Potassium 3.5 - 5.1 mmol/L 4.1  Chloride 101 - 111 mmol/L 106  CO2 22 - 32 mmol/L 22  Calcium 8.9 - 10.3 mg/dL 9.6  Total Protein 6.5 - 8.1 g/dL 6.4(L)  Total Bilirubin 0.3 - 1.2 mg/dL 0.5  Alkaline Phos 38 - 126 U/L 171(H)  AST 15 - 41 U/L 21  ALT 14 - 54 U/L 13(L)    Discharge instruction: per After Visit Summary and "Baby and Me Booklet".  After visit meds:    Medication List    TAKE these medications        acetaminophen 500 MG tablet  Commonly known as:  TYLENOL  Take 1,000 mg by mouth every 6 (six) hours as needed for headache.     calcium carbonate 500 MG chewable tablet  Commonly known as:  TUMS - dosed in  mg elemental calcium  Chew 2 tablets by mouth 2 (two) times daily as needed for indigestion or heartburn.     oxyCODONE-acetaminophen 5-325 MG tablet  Commonly known as:  PERCOCET/ROXICET  Take 1 tablet by mouth every 4 (four) hours as needed (for pain scale 4-7).     PRENATAL 1 PO  Take 3 tablets by mouth daily.         Diet: routine diet  Activity: Advance as tolerated. Pelvic rest for 6 weeks.   Outpatient follow up:2 weeks  Follow up Visit:No Follow-up on file.  Postpartum contraception: Undecided  Newborn Data: Live born female  Birth Weight: 7 lb 6.5 oz (3360 g) APGAR: 7,   Baby Feeding: Breast Disposition:rooming in   07/20/2015 Paula Compton, MD  CNM attestation I have seen and examined this patient and agree with above documentation in the resident's note.   Kim [name] is a 29 y.o. G2P2002 s/p pLTCS.   Pain is well controlled.  Plan for birth control is discuss later.  Method of Feeding: breast  PE:  BP 100/61 mmHg  Pulse 71  Temp(Src) 98 F (36.7 C) (Oral)  Resp 18  Ht 5\' 3"  (1.6 m)  Wt 84.55 kg (186 lb 6.4 oz)  BMI 33.03 kg/m2  SpO2  98%  LMP 09/27/2014  Breastfeeding? Unknown Fundus firm   Recent Labs  07/18/15 0010 07/18/15 0540  HGB 12.4 11.1*  HCT 35.7* 32.2*     Plan: discharge today from OB service - postpartum care discussed - f/u clinic in 2-4 weeks for postpartum visit   Serita Grammes, CNM 8:34 AM  07/20/2015

## 2015-07-21 ENCOUNTER — Encounter: Payer: Medicaid Other | Admitting: Obstetrics & Gynecology

## 2015-07-21 ENCOUNTER — Encounter (HOSPITAL_COMMUNITY): Payer: Self-pay | Admitting: Obstetrics & Gynecology

## 2015-08-15 ENCOUNTER — Encounter: Payer: Self-pay | Admitting: Family

## 2015-08-15 ENCOUNTER — Ambulatory Visit (INDEPENDENT_AMBULATORY_CARE_PROVIDER_SITE_OTHER): Payer: Medicaid Other | Admitting: Family

## 2015-08-15 ENCOUNTER — Other Ambulatory Visit: Payer: Self-pay | Admitting: Obstetrics & Gynecology

## 2015-08-15 DIAGNOSIS — Z98891 History of uterine scar from previous surgery: Secondary | ICD-10-CM | POA: Insufficient documentation

## 2015-08-15 MED ORDER — NORGESTIM-ETH ESTRAD TRIPHASIC 0.18/0.215/0.25 MG-25 MCG PO TABS: 1.0000 | ORAL_TABLET | Freq: Every day | ORAL | Status: DC

## 2015-08-15 NOTE — Progress Notes (Signed)
Patient ID: Kim [name], female   DOB: 1986-03-26, 29 y.o.   MRN: [id number] Post Partum Exam  Kim [name] is a 29 y.o. G28P2002 female who presents for a postpartum visit. She is 4 weeks postpartum following a low cervical transverse Cesarean section. I have fully reviewed the prenatal and intrapartum course. The delivery was at [redacted]w[redacted]d gestational weeks.  Anesthesia: epidural. Postpartum course has been recurrent vaginal yeast infection.  Currently using cream.   Baby's course has been unremarkable. Baby is feeding by bottle - GERBER GOODSTART GENTLE. Bleeding thin lochia. Bowel function is normal. Bladder function is normal. Patient is not sexually active. Contraception method is none, desires  OCP (estrogen/progesterone). Postpartum depression screening: SCORE = 2  The following portions of the patient's history were reviewed and updated as appropriate: allergies, current medications, past family history, past medical history, past social history, past surgical history and problem list.  Review of Systems Pertinent items are noted in HPI.   Objective:    BP 116/78 mmHg  Pulse 78  Resp 16  Ht 5\' 5"  (1.651 m)  Wt 211 lb (95.709 kg)  BMI 35.11 kg/m2  Breastfeeding? Yes  General:  alert, cooperative and appears stated age   Breasts:  inspection negative, no nipple discharge or bleeding, no masses or nodularity palpable  Lungs: clear to auscultation bilaterally  Heart:  regular rate and rhythm, S1, S2 normal, no murmur, click, rub or gallop  Abdomen: soft, non-tender; bowel sounds normal; no masses,  no organomegaly; incision site without signs of infection.   Pelvic exam not indicated - not bleeding, no pain at vaginal area      Assessment:    Normal postpartum exam. Pap smear not done at today's visit.   Plan:    1. Contraception: oral progesterone-only contraceptive 2. Schedule pap smear  Gwen Pounds, CNM

## 2015-09-09 ENCOUNTER — Telehealth: Payer: Self-pay | Admitting: *Deleted

## 2015-09-09 DIAGNOSIS — Z3041 Encounter for surveillance of contraceptive pills: Secondary | ICD-10-CM

## 2015-09-09 MED ORDER — NORGESTIM-ETH ESTRAD TRIPHASIC 0.18/0.215/0.25 MG-25 MCG PO TABS: 1.0000 | ORAL_TABLET | Freq: Every day | ORAL | Status: DC

## 2015-09-09 NOTE — Telephone Encounter (Signed)
Pt called requesting a RF on Orthotricyclen Lo not generic as the generic makes her very nauseated.  She has cancelled her appt on Friday due to her cycle.  She will call back to reschedule.

## 2015-09-12 ENCOUNTER — Ambulatory Visit: Payer: Medicaid Other | Admitting: Family

## 2015-09-25 ENCOUNTER — Ambulatory Visit: Payer: Medicaid Other | Admitting: Obstetrics & Gynecology

## 2015-09-25 ENCOUNTER — Inpatient Hospital Stay (HOSPITAL_COMMUNITY)
Admission: AD | Admit: 2015-09-25 | Discharge: 2015-09-25 | Disposition: A | Discharge status: Home / Self Care | Payer: Medicaid Other | Source: Ambulatory Visit | Attending: Obstetrics & Gynecology | Admitting: Obstetrics & Gynecology

## 2015-09-25 ENCOUNTER — Encounter (HOSPITAL_COMMUNITY): Payer: Self-pay

## 2015-09-25 DIAGNOSIS — Z880 Allergy status to penicillin: Secondary | ICD-10-CM | POA: Diagnosis not present

## 2015-09-25 DIAGNOSIS — N939 Abnormal uterine and vaginal bleeding, unspecified: Secondary | ICD-10-CM | POA: Insufficient documentation

## 2015-09-25 DIAGNOSIS — N938 Other specified abnormal uterine and vaginal bleeding: Secondary | ICD-10-CM | POA: Diagnosis not present

## 2015-09-25 LAB — CBC
HEMATOCRIT: 38.8 % (ref 36.0–46.0)
Hemoglobin: 13.4 g/dL (ref 12.0–15.0)
MCH: 31.5 pg (ref 26.0–34.0)
MCHC: 34.5 g/dL (ref 30.0–36.0)
MCV: 91.1 fL (ref 78.0–100.0)
Platelets: 262 10*3/uL (ref 150–400)
RBC: 4.26 MIL/uL (ref 3.87–5.11)
RDW: 12.9 % (ref 11.5–15.5)
WBC: 6.5 10*3/uL (ref 4.0–10.5)

## 2015-09-25 MED ORDER — NORGESTIMATE-ETH ESTRADIOL 0.25-35 MG-MCG PO TABS: 1.0000 | ORAL_TABLET | Freq: Every day | ORAL | Status: DC

## 2015-09-25 NOTE — MAU Note (Addendum)
Pt reports vaginal bleeding since her c-section in November.  Reports that it has been light for the past 2 weeks after starting oral contraceptives.  Denies pain.

## 2015-09-25 NOTE — MAU Provider Note (Signed)
Chief Complaint:  Vaginal Bleeding   First Provider Initiated Contact with Patient 09/25/15 1516     HPI Kim [name] is a 30 y.o. G2P2002 who presents to maternity admissions reporting light vaginal bleeding since C/S in late November. States only stopped bleeding for one day. THen had dark and brown blood. Started on Ortho Tricyclen Lo OCPs. Does have heavier withdrawal bleed off pills, but spots/bleeds lightly between.   Has mild cramping at times She reports vaginal bleeding, no vaginal itching/burning, urinary symptoms, h/a, dizziness, n/v, or fever/chills.    Taking iron. Last Hgb in November was 11.1  RN Note: Pt reports vaginal bleeding since her c-section in November. Reports that it has been light for the past 2 weeks after starting oral contraceptives. Denies pain.        Past Medical History: Past Medical History  Diagnosis Date  . Fibroid     1.9cm  . 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 GERD (gastroesophageal reflux disease)   . Vaginal Pap smear, abnormal   . HPV (human papilloma virus) infection     Past obstetric history: OB History  Gravida Para Term Preterm AB SAB TAB Ectopic Multiple Living  2 2 2       0 2    # Outcome Date GA Lbr Len/2nd Weight Sex Delivery Anes PTL Lv  2 Term 07/18/15 [redacted]w[redacted]d  7 lb 6.5 oz (3.36 kg) F CS-LTranv Spinal  Y  1 Term 06/07/13 [redacted]w[redacted]d  5 lb 14 oz (2.665 kg) F CS-LTranv Spinal  Y    Obstetric Comments  [name]    Past Surgical History: Past Surgical History  Procedure Laterality Date  . No past surgeries    . Cesarean section N/A 06/07/2013    Procedure: PRIMARY CESAREAN SECTION;  Surgeon: Shelly Bombard, MD;  Location: [facility];  Service: Obstetrics;  Laterality: N/A;  . Cesarean section N/A 07/18/2015    Procedure: CESAREAN SECTION;  Surgeon: Woodroe Mode, MD;  Location: [facility];  Service: Obstetrics;  Laterality: N/A;    Family History: Family History  Problem Relation Age of Onset  . Diabetes Father   .  Diabetes Maternal Grandmother     Social History: Social History  Substance Use Topics  . Smoking status: Never Smoker   . Smokeless tobacco: Never Used  . Alcohol Use: No     Comment: rare  none  while  preg    Allergies:  Allergies  Allergen Reactions  . Naproxen Anaphylaxis    Can take ibuprofen  . Penicillins Other (See Comments)    Family history of allergy, patient prefers to not take this medication Has patient had a PCN reaction causing immediate rash, facial/tongue/throat swelling, SOB or lightheadedness with hypotension: No Has patient had a PCN reaction causing severe rash involving mucus membranes or skin necrosis: No Has patient had a PCN reaction that required hospitalization No Has patient had a PCN reaction occurring within the last 10 years: No If all of the above answers are "NO", then may proceed with Cephalospo    Meds:  Prescriptions prior to admission  Medication Sig Dispense Refill Last Dose  . Ascorbic Acid (VITAMIN C PO) Take by mouth.   09/25/2015 at Unknown time  . Norgestimate-Ethinyl Estradiol Triphasic (ORTHO TRI-CYCLEN LO) 0.18/0.215/0.25 MG-25 MCG tab Take 1 tablet by mouth daily. 1 Package 1 09/25/2015 at Unknown time    I have reviewed patient's Past Medical Hx, Surgical Hx, Family Hx, Social Hx, medications and allergies.  ROS:  Review of Systems  Constitutional: Negative for fever, chills and fatigue.  Respiratory: Negative for shortness of breath.   Cardiovascular: Negative for leg swelling.  Gastrointestinal: Negative for nausea, vomiting, abdominal pain, diarrhea and constipation.  Genitourinary: Positive for vaginal bleeding and pelvic pain (intermittent mild cramping). Negative for vaginal discharge and vaginal pain.  Musculoskeletal: Negative for back pain.   Other systems negative  Physical Exam  Patient Vitals for the past 24 hrs:  BP Temp Temp src Pulse Weight  09/25/15 1458 109/68 mmHg - - 69 160 lb (72.576 kg)  09/25/15 1457  - 17 F (36.7 C) Oral - -   Constitutional: Well-developed, well-nourished female in no acute distress.  Cardiovascular: normal rate and rhythm, no ectopy audible, S1 & S2 heard, no murmur Respiratory: normal effort, no distress. Lungs CTAB with no wheezes or crackles GI: Abd soft, non-tender.  Nondistended.  No rebound, No guarding.  Bowel Sounds audible  MS: Extremities nontender, no edema, normal ROM Neurologic: Alert and oriented x 4.   Grossly nonfocal. GU: Neg CVAT. Skin:  Warm and Dry Psych:  Affect appropriate.  PELVIC EXAM: Declined speculum exam Bimanual exam: Cervix firm, anterior, neg CMT, uterus nontender and well-involuted, nonenlarged, adnexa without tenderness, enlargement, or mass Small dark red blood, no hemorrhage, no uterine tenderness    Labs: No results found for this or any previous visit (from the past 24 hour(s)). --/--/O POS (11/25 0010) Results for orders placed or performed during the hospital encounter of 09/25/15 (from the past 24 hour(s))  CBC     Status: None   Collection Time: 09/25/15  3:41 PM  Result Value Ref Range   WBC 6.5 4.0 - 10.5 K/uL   RBC 4.26 3.87 - 5.11 MIL/uL   Hemoglobin 13.4 12.0 - 15.0 g/dL   HCT 38.8 36.0 - 46.0 %   MCV 91.1 78.0 - 100.0 fL   MCH 31.5 26.0 - 34.0 pg   MCHC 34.5 30.0 - 36.0 g/dL   RDW 12.9 11.5 - 15.5 %   Platelets 262 150 - 400 K/uL    Imaging:  No results found.  MAU Course/MDM: I have ordered labs as follows:  CBC Imaging ordered: none Results reviewed.      Treatments in MAU included none.   Pt stable at time of discharge.  Assessment: 32+ weeks postop from Cesarean Section and placental abruption Light intermenstrual bleeding, probable due to unstable endometrium  Plan: Discharge home Recommend Change to higher dose monophasic pill for 2 months. Then if she wishes, she can switch back to a triphasic low dose, though she risks return of intermenstrual bleeding. Rx sent for Ortho Cyclen 1-35 for  stabilization of endometrium  May stop taking iron    Medication List    ASK your doctor about these medications        Norgestimate-Ethinyl Estradiol Triphasic 0.18/0.215/0.25 MG-25 MCG tab  Commonly known as:  ORTHO TRI-CYCLEN LO  Take 1 tablet by mouth daily.     VITAMIN C PO  Take by mouth.       Encouraged to return here or to other Urgent Care/ED if she develops worsening of symptoms, increase in pain, fever, or other concerning symptoms.   Hansel Feinstein CNM, MSN Certified Nurse-Midwife 09/25/2015 3:16 PM

## 2015-09-25 NOTE — Discharge Instructions (Signed)

## 2015-09-29 ENCOUNTER — Telehealth: Payer: Self-pay

## 2015-09-29 NOTE — Telephone Encounter (Signed)
-----   Message from Seabron Spates, CNM sent at 09/25/2015  3:52 PM EST ----- Regarding: GYN pt transfer Post C/S from November.   Has been going to [place] but lives in [place]  Wants to change to the [employer] office  Can we call her to bring her in?  I am treating her for breakthrough bleeding with pills now in MAU, but she needs a followup with MD there within next few weeks.  Thanks [employer]

## 2015-09-29 NOTE — Telephone Encounter (Signed)
Left message for patient to return call to office to scheduled follow up. Kathrene Alu RN BSN

## 2015-10-02 ENCOUNTER — Encounter (HOSPITAL_COMMUNITY): Payer: Self-pay

## 2015-10-02 ENCOUNTER — Emergency Department (HOSPITAL_COMMUNITY)
Admission: EM | Admit: 2015-10-02 | Discharge: 2015-10-02 | Disposition: A | Discharge status: Home / Self Care | Payer: Medicaid Other | Attending: Emergency Medicine | Admitting: Emergency Medicine

## 2015-10-02 DIAGNOSIS — J029 Acute pharyngitis, unspecified: Secondary | ICD-10-CM | POA: Diagnosis not present

## 2015-10-02 DIAGNOSIS — Z8619 Personal history of other infectious and parasitic diseases: Secondary | ICD-10-CM | POA: Insufficient documentation

## 2015-10-02 DIAGNOSIS — Z8719 Personal history of other diseases of the digestive system: Secondary | ICD-10-CM | POA: Diagnosis not present

## 2015-10-02 DIAGNOSIS — Z79899 Other long term (current) drug therapy: Secondary | ICD-10-CM | POA: Diagnosis not present

## 2015-10-02 DIAGNOSIS — Z880 Allergy status to penicillin: Secondary | ICD-10-CM | POA: Diagnosis not present

## 2015-10-02 DIAGNOSIS — Z86018 Personal history of other benign neoplasm: Secondary | ICD-10-CM | POA: Insufficient documentation

## 2015-10-02 LAB — RAPID STREP SCREEN (MED CTR MEBANE ONLY): STREPTOCOCCUS, GROUP A SCREEN (DIRECT): NEGATIVE

## 2015-10-02 MED ORDER — DEXAMETHASONE SODIUM PHOSPHATE 10 MG/ML IJ SOLN
10.0000 mg | Freq: Once | INTRAMUSCULAR | Status: AC
  Administered 2015-10-02: 10 mg via INTRAMUSCULAR
  Filled 2015-10-02: qty 1

## 2015-10-02 NOTE — Discharge Instructions (Signed)
Pharyngitis Kim [name], you need to see if primary care physician within 3 days for close follow-up. Take Tylenol as needed for pain. If any symptoms worsen come back to emergency department immediately. Thank you. Pharyngitis is a sore throat (pharynx). There is redness, pain, and swelling of your throat. HOME CARE   Drink enough fluids to keep your pee (urine) clear or pale yellow.  Only take medicine as told by your doctor.  You may get sick again if you do not take medicine as told. Finish your medicines, even if you start to feel better.  Do not take aspirin.  Rest.  Rinse your mouth (gargle) with salt water ( tsp of salt per 1 qt of water) every 1-2 hours. This will help the pain.  If you are not at risk for choking, you can suck on hard candy or sore throat lozenges. GET HELP IF:  You have large, tender lumps on your neck.  You have a rash.  You cough up green, yellow-brown, or bloody spit. GET HELP RIGHT AWAY IF:   You have a stiff neck.  You drool or cannot swallow liquids.  You throw up (vomit) or are not able to keep medicine or liquids down.  You have very bad pain that does not go away with medicine.  You have problems breathing (not from a stuffy nose). MAKE SURE YOU:   Understand these instructions.  Will watch your condition.  Will get help right away if you are not doing well or get worse.   This information is not intended to replace advice given to you by your health care provider. Make sure you discuss any questions you have with your health care provider.   Document Released: 01/26/2008 Document Revised: 05/30/2013 Document Reviewed: 04/16/2013 Elsevier Interactive Patient Education [employer].

## 2015-10-02 NOTE — ED Notes (Signed)
Patient verbalized understanding of discharge instructions and denies any further needs or questions at this time. VS stable. Patient ambulatory with steady gait.  

## 2015-10-02 NOTE — ED Provider Notes (Signed)
CSN: [id number]     Arrival date & time 10/02/15  0357 History   First MD Initiated Contact with Patient 10/02/15 [phone]     Chief Complaint  Patient presents with  . Sore Throat     (Consider location/radiation/quality/duration/timing/severity/associated sxs/prior Treatment) HPI Kim [name] is a 30 y.o. female witha past medical history presenting today with sore throat. Patient states this began yesterday. She has sick contacts in both of her children who have sore throat. Patient describes worsening pain with swallowing. She's had a mild cough which is now resolved today. Has been no congestion or runny nose. She denies fevers. She has not taken anything for the sore throat. Patient has no further complaints.  10 Systems reviewed and are negative for acute change except as noted in the HPI.      Past Medical History  Diagnosis Date  . Fibroid     1.9cm  . 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 GERD (gastroesophageal reflux disease)   . Vaginal Pap smear, abnormal   . HPV (human papilloma virus) infection    Past Surgical History  Procedure Laterality Date  . No past surgeries    . Cesarean section N/A 06/07/2013    Procedure: PRIMARY CESAREAN SECTION;  Surgeon: Shelly Bombard, MD;  Location: [facility];  Service: Obstetrics;  Laterality: N/A;  . Cesarean section N/A 07/18/2015    Procedure: CESAREAN SECTION;  Surgeon: Woodroe Mode, MD;  Location: [facility];  Service: Obstetrics;  Laterality: N/A;   Family History  Problem Relation Age of Onset  . Diabetes Father   . Diabetes Maternal Grandmother    Social History  Substance Use Topics  . Smoking status: Never Smoker   . Smokeless tobacco: Never Used  . Alcohol Use: No     Comment: rare  none  while  preg   OB History    Gravida Para Term Preterm AB TAB SAB Ectopic Multiple Living   2 2 2       0 2      Obstetric Comments   Pilar Plate Breech     Review of Systems    Allergies  Naproxen and Penicillins  Home  Medications   Prior to Admission medications   Medication Sig Start Date End Date Taking? Authorizing Provider  Ascorbic Acid (VITAMIN C PO) Take by mouth.    Historical Provider, MD  norgestimate-ethinyl estradiol (ORTHO-CYCLEN,SPRINTEC,PREVIFEM) 0.25-35 MG-MCG tablet Take 1 tablet by mouth daily. 09/25/15   Seabron Spates, CNM   BP 128/84 mmHg  Pulse 87  Temp(Src) 98.8 F (37.1 C) (Oral)  Resp 18  Ht 5\' 5"  (1.651 m)  SpO2 100% Physical Exam  Constitutional: She is oriented to person, place, and time. She appears well-developed and well-nourished. No distress.  HENT:  Head: Normocephalic and atraumatic.  Nose: Nose normal.  Mouth/Throat: Oropharynx is clear and moist. No oropharyngeal exudate.  Eyes: Conjunctivae and EOM are normal. Pupils are equal, round, and reactive to light. No scleral icterus.  Neck: Normal range of motion. Neck supple. No JVD present. No tracheal deviation present. No thyromegaly present.  Cardiovascular: Normal rate, regular rhythm and normal heart sounds.  Exam reveals no gallop and no friction rub.   No murmur heard. Pulmonary/Chest: Effort normal and breath sounds normal. No respiratory distress. She has no wheezes. She exhibits no tenderness.  Abdominal: Soft. Bowel sounds are normal. She exhibits no distension and no mass. There is no tenderness. There is no rebound and no guarding.  Musculoskeletal: Normal  range of motion. She exhibits no edema or tenderness.  Lymphadenopathy:    She has no cervical adenopathy.  Neurological: She is alert and oriented to person, place, and time. No cranial nerve deficit. She exhibits normal muscle tone.  Skin: Skin is warm and dry. No rash noted. No erythema. No pallor.  Nursing note and vitals reviewed.   ED Course  Procedures (including critical care time) Labs Review Labs Reviewed  RAPID STREP SCREEN (NOT AT [facility])  CULTURE, GROUP A STREP [facility])    Imaging Review No results found. I have personally  reviewed and evaluated these images and lab results as part of my medical decision-making.   EKG Interpretation None      MDM   Final diagnoses:  Pharyngitis   patient presents emergency department for sore throat. Physical exam is unremarkable, there is no erythema or exudates in her oropharynx. She was given Decadron for treatment. She is advised to follow the primary care physician within 3 days, she appears well in no acute distress, vital signs were within her normal limits and she is safe for discharge.    Everlene Balls, MD 10/02/15 [phone]

## 2015-10-02 NOTE — ED Notes (Signed)
Pt started having a sore throat yesterday, has two children, one with strep and one with a cold.

## 2015-10-04 LAB — CULTURE, GROUP A STREP (THRC)

## 2015-10-09 ENCOUNTER — Ambulatory Visit (HOSPITAL_BASED_OUTPATIENT_CLINIC_OR_DEPARTMENT_OTHER)
Admission: RE | Admit: 2015-10-09 | Discharge: 2015-10-09 | Disposition: A | Discharge status: Home / Self Care | Payer: Medicaid Other | Source: Ambulatory Visit | Attending: Obstetrics & Gynecology | Admitting: Obstetrics & Gynecology

## 2015-10-09 ENCOUNTER — Ambulatory Visit (HOSPITAL_BASED_OUTPATIENT_CLINIC_OR_DEPARTMENT_OTHER): Payer: Medicaid Other

## 2015-10-09 ENCOUNTER — Ambulatory Visit (INDEPENDENT_AMBULATORY_CARE_PROVIDER_SITE_OTHER): Payer: Medicaid Other | Admitting: Obstetrics & Gynecology

## 2015-10-09 ENCOUNTER — Encounter: Payer: Self-pay | Admitting: Obstetrics & Gynecology

## 2015-10-09 VITALS — BP 117/74 | HR 70 | Ht 65.0 in | Wt 168.0 lb

## 2015-10-09 DIAGNOSIS — O209 Hemorrhage in early pregnancy, unspecified: Secondary | ICD-10-CM

## 2015-10-09 DIAGNOSIS — N939 Abnormal uterine and vaginal bleeding, unspecified: Secondary | ICD-10-CM | POA: Insufficient documentation

## 2015-10-09 NOTE — Progress Notes (Signed)
Patient ID: Kim [name], female   DOB: 1985/11/25, 30 y.o.   MRN: [id number] History:  30 y.o. [id number] here today for continued bleedign post partum.  Pt s/p c-section 07/19/2015.  She reporrts daily bleeding since the delviery. She denies pain.  She was intialy on a low dose triphasic OCP but, was switched to [facility] (generic for Sprintec) pill on 09/25/2015.  She reports no change in her bleeding since that time. She was sexually active with her spouse on the only 2 days that she stopped bleeding ~6weeks ago.  She denies new sexual partners.  No abnormal discharge.   The following portions of the patient's history were reviewed and updated as appropriate: allergies, current medications, past family history, past medical history, past social history, past surgical history and problem list.  Review of Systems:  Pertinent items are noted in HPI.  Objective:  Physical Exam Blood pressure 117/74, pulse 70, height 5\' 5"  (1.651 m), weight 168 lb (76.204 kg), not currently breastfeeding. Gen: NAD Abd: Soft, nontender and nondistended Pelvic: deferred  Labs and Imaging CBC    Component Value Date/Time   WBC 6.5 09/25/2015 1541   RBC 4.26 09/25/2015 1541   HGB 13.4 09/25/2015 1541   HGB 13.7 11/29/2012   HCT 38.8 09/25/2015 1541   HCT 41 11/29/2012   PLT 262 09/25/2015 1541   PLT 281 11/29/2012   MCV 91.1 09/25/2015 1541   MCH 31.5 09/25/2015 1541   MCHC 34.5 09/25/2015 1541   RDW 12.9 09/25/2015 1541   LYMPHSABS 2.1 12/20/2014 0955   MONOABS 0.5 12/20/2014 0955   EOSABS 0.0 12/20/2014 0955   BASOSABS 0.0 12/20/2014 0955       Assessment & Plan:  AUB postpartum  rec pelvic sono to r/o retained POC due to prolonged bleeding PP Cont monophasic OCPs for now.   If sono neg may need to consider atbx for subclinical chroinc endometritis.  I have explained to pt that this is low probability but, may be a consideration  Abron Neddo L. Harraway-Smith, M.D., Cherlynn June

## 2015-10-10 ENCOUNTER — Telehealth: Payer: Self-pay

## 2015-10-10 NOTE — Telephone Encounter (Signed)
Patient called and was wanting to know her follow up plan. Her ultrasound was done last night at 8pm. Patient does have my chart and states Dr. Ihor Dow could respond that way Kathrene Alu RN BNS

## 2015-10-13 NOTE — Telephone Encounter (Signed)
Pls let pt know that I reviewed her entire chart.  Her sono is normal.  She was only on the monophasic pill for 2 weeks.  REC cont Monophasic pill (Sprintec).  clh-S

## 2015-10-13 NOTE — Telephone Encounter (Signed)
Patient returned call and made her aware that Dr. Ihor Dow said her Ultrasound was normal and she should continue her monophasic pill. Patient states understanding. Kathrene Alu RN BSN

## 2015-10-13 NOTE — Telephone Encounter (Signed)
Left message for patient to return call to office.  Kenith Trickel RNBSN 

## 2015-10-14 ENCOUNTER — Ambulatory Visit (HOSPITAL_BASED_OUTPATIENT_CLINIC_OR_DEPARTMENT_OTHER): Payer: Medicaid Other

## 2015-10-30 ENCOUNTER — Encounter: Payer: Medicaid Other | Admitting: Obstetrics & Gynecology

## 2015-11-06 ENCOUNTER — Ambulatory Visit: Payer: Medicaid Other | Admitting: Obstetrics & Gynecology

## 2015-12-04 ENCOUNTER — Ambulatory Visit: Payer: Medicaid Other | Admitting: Obstetrics & Gynecology

## 2016-02-03 ENCOUNTER — Encounter (HOSPITAL_COMMUNITY): Payer: Self-pay | Admitting: *Deleted

## 2016-02-03 ENCOUNTER — Emergency Department (HOSPITAL_COMMUNITY)
Admission: EM | Admit: 2016-02-03 | Discharge: 2016-02-03 | Disposition: A | Discharge status: Home / Self Care | Payer: Medicaid Other | Attending: Emergency Medicine | Admitting: Emergency Medicine

## 2016-02-03 DIAGNOSIS — Z79899 Other long term (current) drug therapy: Secondary | ICD-10-CM | POA: Diagnosis not present

## 2016-02-03 DIAGNOSIS — M545 Low back pain, unspecified: Secondary | ICD-10-CM

## 2016-02-03 DIAGNOSIS — J329 Chronic sinusitis, unspecified: Secondary | ICD-10-CM

## 2016-02-03 LAB — URINALYSIS, ROUTINE W REFLEX MICROSCOPIC
Bilirubin Urine: NEGATIVE
Glucose, UA: NEGATIVE mg/dL
HGB URINE DIPSTICK: NEGATIVE
Ketones, ur: NEGATIVE mg/dL
Leukocytes, UA: NEGATIVE
NITRITE: NEGATIVE
PH: 6 (ref 5.0–8.0)
Protein, ur: NEGATIVE mg/dL
SPECIFIC GRAVITY, URINE: 1.017 (ref 1.005–1.030)

## 2016-02-03 LAB — POC URINE PREG, ED: PREG TEST UR: NEGATIVE

## 2016-02-03 MED ORDER — CETIRIZINE HCL 10 MG PO TABS: 10.0000 mg | ORAL_TABLET | Freq: Every day | ORAL | Status: DC

## 2016-02-03 MED ORDER — FLUTICASONE PROPIONATE 50 MCG/ACT NA SUSP: 2.0000 | Freq: Every day | NASAL | Status: DC

## 2016-02-03 NOTE — ED Notes (Signed)
Patient presents stating she has had sinus problems for 10 days and lower back pain for 2 days.  Denies dysuria or vaginal discharge

## 2016-02-03 NOTE — Discharge Instructions (Signed)
Back Injury Prevention °Back injuries can be very painful. They can also be difficult to heal. After having one back injury, you are more likely to injure your back again. It is important to learn how to avoid injuring or re-injuring your back. The following tips can help you to prevent a back injury. °WHAT SHOULD I KNOW ABOUT PHYSICAL FITNESS? °· Exercise for 30 minutes per day on most days of the week or as directed by your health care provider. Make sure to: °· Do aerobic exercises, such as walking, jogging, biking, or swimming. °· Do exercises that increase balance and strength, such as tai chi and yoga. These can decrease your risk of falling and injuring your back. °· Do stretching exercises to help with flexibility. °· Try to develop strong abdominal muscles. Your abdominal muscles provide a lot of the support that is needed by your back. °· Maintain a healthy weight.  This helps to decrease your risk of a back injury. °WHAT SHOULD I KNOW ABOUT MY DIET? °· Talk with your health care provider about your overall diet. Take supplements and vitamins only as directed by your health care provider. °· Talk with your health care provider about how much calcium and vitamin D you need each day. These nutrients help to prevent weakening of the bones (osteoporosis). Osteoporosis can cause broken (fractured) bones, which lead to back pain. °· Include good sources of calcium in your diet, such as dairy products, green leafy vegetables, and products that have had calcium added to them (fortified). °· Include good sources of vitamin D in your diet, such as milk and foods that are fortified with vitamin D. °WHAT SHOULD I KNOW ABOUT MY POSTURE? °· Sit up straight and stand up straight. Avoid leaning forward when you sit or hunching over when you stand. °· Choose chairs that have good low-back (lumbar) support. °· If you work at a desk, sit close to it so you do not need to lean over. Keep your chin tucked in. Keep your neck  drawn back, and keep your elbows bent at a right angle. Your arms should look like the letter "L." °· Sit high and close to the steering wheel when you drive. Add a lumbar support to your car seat, if needed. °· Avoid sitting or standing in one position for very long. Take breaks to get up, stretch, and walk around at least one time every hour. Take breaks every hour if you are driving for long periods of time. °· Sleep on your side with your knees slightly bent, or sleep on your back with a pillow under your knees. Do not lie on the front of your body to sleep. °WHAT SHOULD I KNOW ABOUT LIFTING, TWISTING, AND REACHING? °Lifting and Heavy Lifting °· Avoid heavy lifting, especially repetitive heavy lifting. If you must do heavy lifting: °· Stretch before lifting. °· Work slowly. °· Rest between lifts. °· Use a tool such as a cart or a dolly to move objects if one is available. °· Make several small trips instead of carrying one heavy load. °· Ask for help when you need it, especially when moving big objects. °· Follow these steps when lifting: °· Stand with your feet shoulder-width apart. °· Get as close to the object as you can. Do not try to pick up a heavy object that is far from your body. °· Use handles or lifting straps if they are available. °· Bend at your knees. Squat down, but keep your heels off the floor. °·   Keep your shoulders pulled back, your chin tucked in, and your back straight.  Lift the object slowly while you tighten the muscles in your legs, abdomen, and buttocks. Keep the object as close to the center of your body as possible.  Follow these steps when putting down a heavy load:  Stand with your feet shoulder-width apart.  Lower the object slowly while you tighten the muscles in your legs, abdomen, and buttocks. Keep the object as close to the center of your body as possible.  Keep your shoulders pulled back, your chin tucked in, and your back straight.  Bend at your knees. Squat  down, but keep your heels off the floor.  Use handles or lifting straps if they are available. Twisting and Reaching  Avoid lifting heavy objects above your waist.  Do not twist at your waist while you are lifting or carrying a load. If you need to turn, move your feet.  Do not bend over without bending at your knees.  Avoid reaching over your head, across a table, or for an object on a high surface. WHAT ARE SOME OTHER TIPS?  Avoid wet floors and icy ground. Keep sidewalks clear of ice to prevent falls.  Do not sleep on a mattress that is too soft or too hard.  Keep items that are used frequently within easy reach.  Put heavier objects on shelves at waist level, and put lighter objects on lower or higher shelves.  Find ways to decrease your stress, such as exercise, massage, or relaxation techniques. Stress can build up in your muscles. Tense muscles are more vulnerable to injury.  Talk with your health care provider if you feel anxious or depressed. These conditions can make back pain worse.  Wear flat heel shoes with cushioned soles.  Avoid sudden movements.  Use both shoulder straps when carrying a backpack.  Do not use any tobacco products, including cigarettes, chewing tobacco, or electronic cigarettes. If you need help quitting, ask your health care provider.   This information is not intended to replace advice given to you by your health care provider. Make sure you discuss any questions you have with your health care provider.   Document Released: 09/16/2004 Document Revised: 12/24/2014 Document Reviewed: 08/13/2014 Elsevier Interactive Patient Education 2016 Else[employer]  Sinusitis, Adult Sinusitis is redness, soreness, and inflammation of the paranasal sinuses. Paranasal sinuses are air pockets within the bones of your face. They are located beneath your eyes, in the middle of your forehead, and above your eyes. In healthy paranasal sinuses, mucus is able to drain  out, and air is able to circulate through them by way of your nose. However, when your paranasal sinuses are inflamed, mucus and air can become trapped. This can allow bacteria and other germs to grow and cause infection. Sinusitis can develop quickly and last only a short time (acute) or continue over a long period (chronic). Sinusitis that lasts for more than 12 weeks is considered chronic. CAUSES Causes of sinusitis include:  Allergies.  Structural abnormalities, such as displacement of the cartilage that separates your nostrils (deviated septum), which can decrease the air flow through your nose and sinuses and affect sinus drainage.  Functional abnormalities, such as when the small hairs (cilia) that line your sinuses and help remove mucus do not work properly or are not present. SIGNS AND SYMPTOMS Symptoms of acute and chronic sinusitis are the same. The primary symptoms are pain and pressure around the affected sinuses. Other symptoms include:  Upper toothache.  Earache.  Headache.  Bad breath.  Decreased sense of smell and taste.  A cough, which worsens when you are lying flat.  Fatigue.  Fever.  Thick drainage from your nose, which often is green and may contain pus (purulent).  Swelling and warmth over the affected sinuses. DIAGNOSIS Your health care provider will perform a physical exam. During your exam, your health care provider may perform any of the following to help determine if you have acute sinusitis or chronic sinusitis:  Look in your nose for signs of abnormal growths in your nostrils (nasal polyps).  Tap over the affected sinus to check for signs of infection.  View the inside of your sinuses using an imaging device that has a light attached (endoscope). If your health care provider suspects that you have chronic sinusitis, one or more of the following tests may be recommended:  Allergy tests.  Nasal culture. A sample of mucus is taken from your nose,  sent to a lab, and screened for bacteria.  Nasal cytology. A sample of mucus is taken from your nose and examined by your health care provider to determine if your sinusitis is related to an allergy. TREATMENT Most cases of acute sinusitis are related to a viral infection and will resolve on their own within 10 days. Sometimes, medicines are prescribed to help relieve symptoms of both acute and chronic sinusitis. These may include pain medicines, decongestants, nasal steroid sprays, or saline sprays. However, for sinusitis related to a bacterial infection, your health care provider will prescribe antibiotic medicines. These are medicines that will help kill the bacteria causing the infection. Rarely, sinusitis is caused by a fungal infection. In these cases, your health care provider will prescribe antifungal medicine. For some cases of chronic sinusitis, surgery is needed. Generally, these are cases in which sinusitis recurs more than 3 times per year, despite other treatments. HOME CARE INSTRUCTIONS  Drink plenty of water. Water helps thin the mucus so your sinuses can drain more easily.  Use a humidifier.  Inhale steam 3-4 times a day (for example, sit in the bathroom with the shower running).  Apply a warm, moist washcloth to your face 3-4 times a day, or as directed by your health care provider.  Use saline nasal sprays to help moisten and clean your sinuses.  Take medicines only as directed by your health care provider.  If you were prescribed either an antibiotic or antifungal medicine, finish it all even if you start to feel better. SEEK IMMEDIATE MEDICAL CARE IF:  You have increasing pain or severe headaches.  You have nausea, vomiting, or drowsiness.  You have swelling around your face.  You have vision problems.  You have a stiff neck.  You have difficulty breathing.   This information is not intended to replace advice given to you by your health care provider. Make  sure you discuss any questions you have with your health care provider.   Document Released: 08/09/2005 Document Revised: 08/30/2014 Document Reviewed: 08/24/2011 Elsevier Interactive Patient Education [employer].

## 2016-02-03 NOTE — ED Provider Notes (Signed)
CSN: [id number]     Arrival date & time 02/03/16  0547 History   First MD Initiated Contact with Patient 02/03/16 [phone]     Chief Complaint  Patient presents with  . Sinus Problems   . Back Pain     (Consider location/radiation/quality/duration/timing/severity/associated sxs/prior Treatment) Patient is a 30 y.o. female presenting with back pain and general illness.  Back Pain Location:  Lumbar spine Quality:  Aching Radiates to:  Does not radiate Pain severity:  Mild Pain is:  Same all the time Onset quality:  Gradual Duration:  1 day Timing:  Intermittent Progression:  Unchanged Chronicity:  New Context: lifting heavy objects and twisting   Relieved by:  Bed rest and OTC medications Worsened by:  Bending Ineffective treatments:  None tried Associated symptoms: headaches (intermittent)   Associated symptoms: no abdominal pain, no abdominal swelling, no bladder incontinence, no bowel incontinence, no dysuria, no fever, no leg pain, no numbness, no paresthesias, no pelvic pain, no perianal numbness, no tingling and no weakness   Risk factors: no hx of cancer   Illness Location:  Sinus pressure Severity:  Mild Onset quality:  Gradual Duration:  10 days Timing:  Constant Progression:  Unchanged Chronicity:  New Relieved by:  Benadryl Associated symptoms: congestion, cough (intermittent, dry), headaches (intermittent) and rhinorrhea   Associated symptoms: no abdominal pain, no ear pain, no fever, no myalgias, no nausea, no shortness of breath, no sore throat, no vomiting and no wheezing   Congestion:    Location:  Nasal  Kim [name] is a 30 y.o. female with no significant PMH significantHe presents for evaluation of 10 day history of constant, unchanging sinus pressure with associated rhinorrhea, postnasal drip, and intermittent headaches. She denies any fever, chills, neck stiffness, neck pain, otalgia, sore throat, visual disturbances, or eye pain. She has taken Benadryl  with some relief. She is also complaining of constant, mild, nonradiating low back pain that began when she was ending over. Patient reports she recently started yoga and she has a six-month child at home, so she has been doing a lot of bending and twisting. She has taken Tylenol with some relief. She denies any IV drug use or cancer. Denies any abdominal pain, urinary symptoms, vaginal discharge, saddle anesthesia, or bowel or bladder incontinence.  LNMP 01/25/16.    Past Medical History  Diagnosis Date  . Fibroid     1.9cm  . 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 GERD (gastroesophageal reflux disease)   . Vaginal Pap smear, abnormal   . HPV (human papilloma virus) infection    Past Surgical History  Procedure Laterality Date  . No past surgeries    . Cesarean section N/A 06/07/2013    Procedure: PRIMARY CESAREAN SECTION;  Surgeon: Shelly Bombard, MD;  Location: [facility];  Service: Obstetrics;  Laterality: N/A;  . Cesarean section N/A 07/18/2015    Procedure: CESAREAN SECTION;  Surgeon: Woodroe Mode, MD;  Location: [facility];  Service: Obstetrics;  Laterality: N/A;   Family History  Problem Relation Age of Onset  . Diabetes Father   . Diabetes Maternal Grandmother   . Cancer Maternal Aunt     breast  . Cancer Paternal Grandmother    Social History  Substance Use Topics  . Smoking status: Never Smoker   . Smokeless tobacco: Never Used  . Alcohol Use: No     Comment: rare  none  while  preg   OB History    Gravida Para Term Preterm AB  TAB SAB Ectopic Multiple Living   2 2 2       0 2      Obstetric Comments   Pilar Plate Breech     Review of Systems  Constitutional: Negative for fever and chills.  HENT: Positive for congestion, postnasal drip, rhinorrhea and sinus pressure. Negative for ear discharge, ear pain, facial swelling and sore throat.   Eyes: Negative for pain and visual disturbance.  Respiratory: Positive for cough (intermittent, dry). Negative for shortness of breath and  wheezing.   Gastrointestinal: Negative for nausea, vomiting, abdominal pain and bowel incontinence.  Genitourinary: Negative for bladder incontinence, dysuria, frequency, hematuria, vaginal discharge and pelvic pain.  Musculoskeletal: Positive for back pain. Negative for myalgias, neck pain and neck stiffness.  Neurological: Positive for headaches (intermittent). Negative for tingling, weakness, numbness and paresthesias.  All other systems reviewed and are negative.     Allergies  Naproxen and Penicillins  Home Medications   Prior to Admission medications   Medication Sig Start Date End Date Taking? Authorizing Provider  Ascorbic Acid (VITAMIN C PO) Take by mouth.    Historical Provider, MD  norgestimate-ethinyl estradiol (ORTHO-CYCLEN,SPRINTEC,PREVIFEM) 0.25-35 MG-MCG tablet Take 1 tablet by mouth daily. 09/25/15   Seabron Spates, CNM   BP 130/93 mmHg  Pulse 75  Temp(Src) 98.5 F (36.9 C) (Oral)  Resp 14  SpO2 99%  LMP 01/25/2016 Physical Exam  Constitutional: She is oriented to person, place, and time. She appears well-developed and well-nourished.  HENT:  Head: Normocephalic and atraumatic.  Right Ear: Tympanic membrane and external ear normal.  Left Ear: Tympanic membrane and external ear normal.  Nose: Nose normal. No mucosal edema or rhinorrhea. Right sinus exhibits no maxillary sinus tenderness and no frontal sinus tenderness. Left sinus exhibits no maxillary sinus tenderness and no frontal sinus tenderness.  Mouth/Throat: Uvula is midline, oropharynx is clear and moist and mucous membranes are normal. No trismus in the jaw. No oropharyngeal exudate, posterior oropharyngeal edema or posterior oropharyngeal erythema.  Eyes: Conjunctivae are normal.  Neck:  No nuchal rigidity. Full active range of motion of neck without pain.  Cardiovascular: Normal rate, regular rhythm, normal heart sounds and intact distal pulses.   Pulses:      Dorsalis pedis pulses are 2+ on the  right side, and 2+ on the left side.  Pulmonary/Chest: Effort normal and breath sounds normal.  Abdominal: Soft. Bowel sounds are normal. She exhibits no distension. There is no tenderness. There is no rebound and no guarding.  Musculoskeletal: Normal range of motion. She exhibits no tenderness.  No spinous process tenderness.  No step offs. No crepitus.  Neurological: She is alert and oriented to person, place, and time.  No saddle anesthesia. Strength and sensation intact bilaterally throughout lower extremities.  Skin: Skin is warm and dry.  Psychiatric: She has a normal mood and affect. Her behavior is normal.    ED Course  Procedures (including critical care time) Labs Review Labs Reviewed  URINALYSIS, [place] (NOT AT [facility])  POC URINE PREG, ED    Imaging Review No results found. I have personally reviewed and evaluated these images and lab results as part of my medical decision-making.   EKG Interpretation None      MDM   Final diagnoses:  Sinusitis, unspecified chronicity, unspecified location  Bilateral low back pain without sciatica   Patient presents for evaluation of sinus pressure and low back pain. VSS, NAD. On exam, she appears well, nontoxic or septic. HENT  exam unremarkable. No nuchal rigidity. No L-spine tenderness. Full active range of motion of L-spine. Normal neurological exam. No red flags. UA without infection.  She is not pregnant. At this time I do not feel as though abx are indicated.  Will discharge home with Zyrtec, Flonase.  Recommend NSAIDs or Tylenol for pain control.  Follow up [place].  Discussed return precautions.  Patient agrees and acknowledges the above plan for discharge.         Gloriann Loan, PA-C 02/03/16 [id number]  Sherwood Gambler, MD 02/08/16 [phone]

## 2016-02-26 ENCOUNTER — Encounter: Payer: Self-pay | Admitting: Family Medicine

## 2016-02-26 ENCOUNTER — Ambulatory Visit (INDEPENDENT_AMBULATORY_CARE_PROVIDER_SITE_OTHER): Payer: Medicaid Other | Admitting: Family Medicine

## 2016-02-26 VITALS — BP 111/74 | HR 94 | Temp 98.5°F | Resp 16 | Ht 65.0 in | Wt 157.0 lb

## 2016-02-26 DIAGNOSIS — Z23 Encounter for immunization: Secondary | ICD-10-CM | POA: Diagnosis not present

## 2016-02-26 DIAGNOSIS — Z130 Encounter for screening for diseases of the blood and blood-forming organs and certain disorders involving the immune mechanism: Secondary | ICD-10-CM | POA: Diagnosis not present

## 2016-02-26 DIAGNOSIS — Z7689 Persons encountering health services in other specified circumstances: Secondary | ICD-10-CM

## 2016-02-26 DIAGNOSIS — Z7189 Other specified counseling: Secondary | ICD-10-CM

## 2016-02-26 DIAGNOSIS — Z3492 Encounter for supervision of normal pregnancy, unspecified, second trimester: Secondary | ICD-10-CM | POA: Diagnosis not present

## 2016-02-26 DIAGNOSIS — H547 Unspecified visual loss: Secondary | ICD-10-CM | POA: Diagnosis not present

## 2016-02-26 DIAGNOSIS — Z131 Encounter for screening for diabetes mellitus: Secondary | ICD-10-CM

## 2016-02-26 LAB — CBC WITH DIFFERENTIAL/PLATELET
BASOS PCT: 1 %
Basophils Absolute: 73 cells/uL (ref 0–200)
Eosinophils Absolute: 73 cells/uL (ref 15–500)
Eosinophils Relative: 1 %
HCT: 43 % (ref 35.0–45.0)
Hemoglobin: 14.7 g/dL (ref 11.7–15.5)
LYMPHS PCT: 34 %
Lymphs Abs: 2482 cells/uL (ref 850–3900)
MCH: 31.6 pg (ref 27.0–33.0)
MCHC: 34.2 g/dL (ref 32.0–36.0)
MCV: 92.5 fL (ref 80.0–100.0)
MONOS PCT: 6 %
MPV: 11.2 fL (ref 7.5–12.5)
Monocytes Absolute: 438 cells/uL (ref 200–950)
Neutro Abs: 4234 cells/uL (ref 1500–7800)
Neutrophils Relative %: 58 %
PLATELETS: 279 10*3/uL (ref 140–400)
RBC: 4.65 MIL/uL (ref 3.80–5.10)
RDW: 13.5 % (ref 11.0–15.0)
WBC: 7.3 10*3/uL (ref 3.8–10.8)

## 2016-02-26 LAB — COMPLETE METABOLIC PANEL WITH GFR
ALT: 14 U/L (ref 6–29)
AST: 19 U/L (ref 10–30)
Albumin: 4.1 g/dL (ref 3.6–5.1)
Alkaline Phosphatase: 55 U/L (ref 33–115)
BILIRUBIN TOTAL: 0.4 mg/dL (ref 0.2–1.2)
BUN: 9 mg/dL (ref 7–25)
CALCIUM: 9.6 mg/dL (ref 8.6–10.2)
CO2: 27 mmol/L (ref 20–31)
CREATININE: 1.12 mg/dL — AB (ref 0.50–1.10)
Chloride: 103 mmol/L (ref 98–110)
GFR, EST AFRICAN AMERICAN: 76 mL/min (ref >=60)
GFR, Est Non African American: 66 mL/min (ref >=60)
Glucose, Bld: 90 mg/dL (ref 65–99)
Potassium: 4.4 mmol/L (ref 3.5–5.3)
Sodium: 141 mmol/L (ref 135–146)
TOTAL PROTEIN: 7.2 g/dL (ref 6.1–8.1)

## 2016-02-26 NOTE — Patient Instructions (Signed)
Make an appointment for PAP.  Try to eat a diet low in fats, cholesterol and sweets. Eat more vegetables and fruits. Try to exercise regularly.

## 2016-02-26 NOTE — Progress Notes (Signed)
Patient ID: Kim [name], female   DOB: 1985/11/10, 30 y.o.   MRN: [id number]   Kim [name], is a 30 y.o. female  [id number]  [id number]  DOB - 1986-07-17  CC:  Chief Complaint  Patient presents with  . Establish Care    red round rash on arm and stomach.   . Eye Problem    needs eye referral        HPI: Kim [name] is a 30 y.o. female here to establish care. She has no significant history of chronic conditions. She is only on [facility] per her GYN and OTC vitamins and minerals. She does request a referral to opthalmology as at her last visit there was some mention of "increased pressure" and they require a referral due to medicaid. She offers no significant complaints today. She does report a history of + HVP which is followed by GYN.  Health Maintenance:  Is in need of PAP (will call GYN) Reports more than 5 years since she had a tetanus booster.  Allergies  Allergen Reactions  . Naproxen Anaphylaxis    Can take ibuprofen  . Penicillins Other (See Comments)    Family history of allergy, patient prefers to not take this medication Has patient had a PCN reaction causing immediate rash, facial/tongue/throat swelling, SOB or lightheadedness with hypotension: No Has patient had a PCN reaction causing severe rash involving mucus membranes or skin necrosis: No Has patient had a PCN reaction that required hospitalization No Has patient had a PCN reaction occurring within the last 10 years: No If all of the above answers are "NO", then may proceed with Cephalospo   Past Medical History  Diagnosis Date  . Fibroid     1.9cm  . 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 GERD (gastroesophageal reflux disease)   . Vaginal Pap smear, abnormal   . HPV (human papilloma virus) infection    Current Outpatient Prescriptions on File Prior to Visit  Medication Sig Dispense Refill  . Ascorbic Acid (VITAMIN C PO) Take by mouth.    . norgestimate-ethinyl estradiol (ORTHO-CYCLEN,SPRINTEC,PREVIFEM)  0.25-35 MG-MCG tablet Take 1 tablet by mouth daily. 1 Package 11  . cetirizine (ZYRTEC) 10 MG tablet Take 1 tablet (10 mg total) by mouth daily. (Patient not taking: Reported on 02/26/2016) 30 tablet 0  . fluticasone (FLONASE) 50 MCG/ACT nasal spray Place 2 sprays into both nostrils daily. (Patient not taking: Reported on 02/26/2016) 16 g 0   No current facility-administered medications on file prior to visit.   Family History  Problem Relation Age of Onset  . Diabetes Father   . Diabetes Maternal Grandmother   . Cancer Maternal Aunt     breast  . Cancer Paternal Grandmother    Social History   Social History  . Marital Status: Married    Spouse Name: N/A  . Number of Children: N/A  . Years of Education: N/A   Occupational History  . [employer]    Social History Main Topics  . Smoking status: Never Smoker   . Smokeless tobacco: Never Used  . Alcohol Use: 0.0 oz/week    0 Standard drinks or equivalent per week     Comment: rare  . Drug Use: No  . Sexual Activity:    Partners: Male    Birth Control/ Protection: Pill     Comment: Oct 28 2014   Other Topics Concern  . Not on file   Social History Narrative    Review of Systems: Constitutional: Negative for fever, chills,  appetite change, weight loss,  Fatigue. Skin: Negative for rashes or lesions of concern. HENT: Negative for ear pain, ear discharge.nose bleeds Eyes: Negative for pain, discharge, redness, itching. Positive for decrease visual accuity and check for glaucome Neck: Negative for pain, stiffness Respiratory: Negative for cough, shortness of breath,   Cardiovascular: Negative for chest pain, palpitations and leg swelling. Gastrointestinal: Negative for abdominal pain, nausea, vomiting, diarrhea, constipations Genitourinary: Negative for dysuria, urgency, frequency, hematuria,  Musculoskeletal: Negative for back pain, joint pain, joint  swelling, and gait problem.Negative for weakness. Neurological: Negative for  dizziness, tremors, seizures, syncope,   light-headedness, numbness and headaches.  Hematological: Negative for easy bruising or bleeding Psychiatric/Behavioral: Negative for depression, anxiety, decreased concentration, confusion   Objective:   Filed Vitals:   02/26/16 1317  BP: 111/74  Pulse: 94  Temp: 98.5 F (36.9 C)  Resp: 16    Physical Exam: Constitutional: Patient appears well-developed and well-nourished. No distress. HENT: Normocephalic, atraumatic, External right and left ear normal. Oropharynx is clear and moist.  Eyes: Conjunctivae and EOM are normal. PERRLA, no scleral icterus. Neck: Normal ROM. Neck supple. No lymphadenopathy, No thyromegaly. CVS: RRR, S1/S2 +, no murmurs, no gallops, no rubs Pulmonary: Effort and breath sounds normal, no stridor, rhonchi, wheezes, rales.  Abdominal: Soft. Normoactive BS,, no distension, tenderness, rebound or guarding.  Musculoskeletal: Normal range of motion. No edema and no tenderness.  Neuro: Alert.Normal muscle tone coordination. Non-focal Skin: Skin is warm and dry. No rash noted. Not diaphoretic. No erythema. No pallor. Psychiatric: Normal mood and affect. Behavior, judgment, thought content normal.  Lab Results  Component Value Date   WBC 6.5 09/25/2015   HGB 13.4 09/25/2015   HCT 38.8 09/25/2015   MCV 91.1 09/25/2015   PLT 262 09/25/2015   Lab Results  Component Value Date   CREATININE 0.77 07/18/2015   BUN 9 07/18/2015   NA 136 07/18/2015   K 4.1 07/18/2015   CL 106 07/18/2015   CO2 22 07/18/2015    No results found for: HGBA1C Lipid Panel  No results found for: CHOL, TRIG, HDL, CHOLHDL, VLDL, LDLCALC     Assessment and plan:   1. Decreased vision  - Ambulatory referral to Ophthalmology  2. Screening for diabetes mellitus  - COMPLETE METABOLIC PANEL WITH GFR  3. Screening for deficiency anemia  - CBC with Differential  4. Encounter to establish care - I have reviewed information presented by  the patient and pertinent information from her chart.    Return in about 6 months (around 08/28/2016).  The patient was given clear instructions to go to ER or return to medical center if symptoms don't improve, worsen or new problems develop. The patient verbalized understanding.    Micheline Chapman FNP  02/26/2016, 2:03 PM

## 2016-03-03 ENCOUNTER — Emergency Department (HOSPITAL_COMMUNITY)
Admission: EM | Admit: 2016-03-03 | Discharge: 2016-03-04 | Disposition: A | Discharge status: Home / Self Care | Payer: Medicaid Other | Attending: Emergency Medicine | Admitting: Emergency Medicine

## 2016-03-03 ENCOUNTER — Encounter (HOSPITAL_COMMUNITY): Payer: Self-pay | Admitting: Emergency Medicine

## 2016-03-03 ENCOUNTER — Emergency Department (HOSPITAL_COMMUNITY): Payer: Medicaid Other

## 2016-03-03 DIAGNOSIS — R0789 Other chest pain: Secondary | ICD-10-CM

## 2016-03-03 LAB — BASIC METABOLIC PANEL
Anion gap: 8 (ref 5–15)
BUN: 6 mg/dL (ref 6–20)
CHLORIDE: 102 mmol/L (ref 101–111)
CO2: 26 mmol/L (ref 22–32)
CREATININE: 0.79 mg/dL (ref 0.44–1.00)
Calcium: 9.7 mg/dL (ref 8.9–10.3)
GFR calc Af Amer: >60 mL/min (ref >60)
GFR calc non Af Amer: >60 mL/min (ref >60)
GLUCOSE: 96 mg/dL (ref 65–99)
POTASSIUM: 3.7 mmol/L (ref 3.5–5.1)
SODIUM: 136 mmol/L (ref 135–145)

## 2016-03-03 LAB — I-STAT TROPONIN, ED: Troponin i, poc: 0 ng/mL (ref 0.00–0.08)

## 2016-03-03 LAB — CBC
HEMATOCRIT: 40.8 % (ref 36.0–46.0)
Hemoglobin: 14 g/dL (ref 12.0–15.0)
MCH: 31.5 pg (ref 26.0–34.0)
MCHC: 34.3 g/dL (ref 30.0–36.0)
MCV: 91.9 fL (ref 78.0–100.0)
PLATELETS: 302 10*3/uL (ref 150–400)
RBC: 4.44 MIL/uL (ref 3.87–5.11)
RDW: 12.8 % (ref 11.5–15.5)
WBC: 9.4 10*3/uL (ref 4.0–10.5)

## 2016-03-03 NOTE — ED Notes (Signed)
Pt. reports left chest pain for 2 days with mild SOB , denies nausea or diaphoresis .

## 2016-03-04 ENCOUNTER — Encounter: Payer: Self-pay | Admitting: Obstetrics & Gynecology

## 2016-03-04 ENCOUNTER — Other Ambulatory Visit (HOSPITAL_COMMUNITY)
Admission: RE | Admit: 2016-03-04 | Discharge: 2016-03-04 | Disposition: A | Discharge status: Home / Self Care | Payer: Medicaid Other | Source: Ambulatory Visit | Attending: Obstetrics & Gynecology | Admitting: Obstetrics & Gynecology

## 2016-03-04 ENCOUNTER — Ambulatory Visit (INDEPENDENT_AMBULATORY_CARE_PROVIDER_SITE_OTHER): Payer: Medicaid Other | Admitting: Obstetrics & Gynecology

## 2016-03-04 VITALS — BP 128/79 | HR 75 | Ht 65.0 in | Wt 157.0 lb

## 2016-03-04 DIAGNOSIS — N760 Acute vaginitis: Secondary | ICD-10-CM | POA: Diagnosis present

## 2016-03-04 DIAGNOSIS — Z1151 Encounter for screening for human papillomavirus (HPV): Secondary | ICD-10-CM | POA: Diagnosis not present

## 2016-03-04 DIAGNOSIS — Z3009 Encounter for other general counseling and advice on contraception: Secondary | ICD-10-CM

## 2016-03-04 DIAGNOSIS — Z01411 Encounter for gynecological examination (general) (routine) with abnormal findings: Secondary | ICD-10-CM | POA: Insufficient documentation

## 2016-03-04 DIAGNOSIS — Z0000 Encounter for general adult medical examination without abnormal findings: Secondary | ICD-10-CM

## 2016-03-04 DIAGNOSIS — Z113 Encounter for screening for infections with a predominantly sexual mode of transmission: Secondary | ICD-10-CM | POA: Diagnosis present

## 2016-03-04 DIAGNOSIS — Z3041 Encounter for surveillance of contraceptive pills: Secondary | ICD-10-CM | POA: Diagnosis not present

## 2016-03-04 DIAGNOSIS — Z01419 Encounter for gynecological examination (general) (routine) without abnormal findings: Secondary | ICD-10-CM

## 2016-03-04 MED ORDER — NORGESTIMATE-ETH ESTRADIOL 0.25-35 MG-MCG PO TABS: 1.0000 | ORAL_TABLET | Freq: Every day | ORAL | Status: AC

## 2016-03-04 MED ORDER — IBUPROFEN 800 MG PO TABS: 800.0000 mg | ORAL_TABLET | Freq: Three times a day (TID) | ORAL | Status: AC

## 2016-03-04 MED ORDER — OXYCODONE-ACETAMINOPHEN 5-325 MG PO TABS: 1.0000 | ORAL_TABLET | ORAL | Status: AC | PRN

## 2016-03-04 MED ORDER — IBUPROFEN 800 MG PO TABS
800.0000 mg | ORAL_TABLET | Freq: Once | ORAL | Status: AC
  Administered 2016-03-04: 800 mg via ORAL
  Filled 2016-03-04: qty 1

## 2016-03-04 NOTE — Progress Notes (Signed)
Patient ID: Kim [name], female   DOB: 1986-05-03, 30 y.o.   MRN: [id number] Subjective:     Kim [name] is a 30 y.o. female here for a routine exam. 02/18/2016. G2P2 Current complaints: pt c/o cramping for 7 days.  Pt reports that this is not related to her cycle but, she does reports pain with her cycles usually. No meds taken.  Tylenol will help if she takes it.  Pt is interested in transitioning to natural faamily planning.  She had a period of 3-4 months of AUB PP that has now improved.     Gynecologic History Patient's last menstrual period was 02/18/2016. Contraception: OCP (estrogen/progesterone) Sprintec Last Pap: 07/2014. Results were: abnormal.  Pt was dx'd with HPV Last mammogram: n/a.   Obstetric History OB History  Gravida Para Term Preterm AB SAB TAB Ectopic Multiple Living  2 2 2       0 2    # Outcome Date GA Lbr Len/2nd Weight Sex Delivery Anes PTL Lv  2 Term 07/18/15 [redacted]w[redacted]d  7 lb 6.5 oz (3.36 kg) F CS-LTranv Spinal  Y  1 Term 06/07/13 [redacted]w[redacted]d  5 lb 14 oz (2.665 kg) F CS-LTranv Spinal  Y    Obstetric Comments  Pilar Plate Breech   The following portions of the patient's history were reviewed and updated as appropriate: allergies, current medications, past family history, past medical history, past social history, past surgical history and problem list.  Review of Systems Pertinent items are noted in HPI.    Objective:   BP 128/79 mmHg  Pulse 75  Ht 5\' 5"  (1.651 m)  Wt 157 lb (71.215 kg)  BMI 26.13 kg/m2  LMP 02/18/2016 General Appearance:    Alert, cooperative, no distress, appears stated age  Head:    Normocephalic, without obvious abnormality, atraumatic  Eyes:    conjunctiva/corneas clear, EOM's intact, both eyes  Ears:    Normal external ear canals, both ears  Nose:   Nares normal, septum midline, mucosa normal, no drainage    or sinus tenderness  Throat:   Lips, mucosa, and tongue normal; teeth and gums normal  Neck:   Supple, symmetrical, trachea midline, no  adenopathy;    thyroid:  no enlargement/tenderness/nodules  Back:     Symmetric, no curvature, ROM normal, no CVA tenderness  Lungs:     Clear to auscultation bilaterally, respirations unlabored  Chest Wall:    No tenderness or deformity   Heart:    Regular rate and rhythm, S1 and S2 normal, no murmur, rub   or gallop  Breast Exam:    No tenderness, masses, or nipple abnormality  Abdomen:     Soft, non-tender, bowel sounds active all four quadrants,    no masses, no organomegaly  Genitalia:    Normal female without lesion, discharge or tenderness     Extremities:   Extremities normal, atraumatic, no cyanosis or edema  Pulses:   2+ and symmetric all extremities  Skin:   Skin color, texture, turgor normal, no rashes or lesions      Assessment:    Healthy female exam.   Contraception counseling- rec completing at lest 6 months of OCPs to regulate cycles prior to attempting NFP.   +hrHPV on last pap 1 week of cramping- normal exam   Plan:    Contraception: OCP (estrogen/progesterone). Follow up in: 1 year.    F/u PAP and cx F/u if cramping persists  Kim [name], M.D., Kim [name]

## 2016-03-04 NOTE — ED Provider Notes (Signed)
CSN: [id number]     Arrival date & time 03/03/16  1909 History  By signing my name below, I, Jasmyn B. Alexander, attest that this documentation has been prepared under the direction and in the presence of Delora Fuel, MD.  Electronically Signed: Tedra Coupe. Sheppard Coil, ED Scribe. 03/04/2016. 12:35 AM.   Chief Complaint  Patient presents with  . Chest Pain   The history is provided by the patient. No language interpreter was used.    HPI Comments: Kim [name] is a 30 y.o. female who presents to the Emergency Department complaining of gradual onset, constant, sharp, 8/10, left-sided chest pain x 2 days. Pt has associated shortness of breath. Pain is exacerbated with applying pressure, leaning forward, and deep breaths. Pain is relieved when arms are extended above head. She has taken aspirin and Tylenol for pain with no relief. She has never had any similar episodes in the past. Denies any hx of DVT. Denies any recent surgery or long-distance travel. Denies any chills, diaphoresis, fever, and myalgias. Pt is not a smoker.  Pt also complains of left-sided neck pain x 2 days that was relieved with placement of a heating pad. Pt has 8/10 lower abdominal pain around her C-section incision x 1 day. Pt had associated nausea but it has now resolved. Pain is exacerbated upon applying pressure. Denies any diarrhea.   Past Medical History  Diagnosis Date  . Fibroid     1.9cm  . 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 GERD (gastroesophageal reflux disease)   . Vaginal Pap smear, abnormal   . HPV (human papilloma virus) infection    Past Surgical History  Procedure Laterality Date  . No past surgeries    . Cesarean section N/A 06/07/2013    Procedure: PRIMARY CESAREAN SECTION;  Surgeon: Shelly Bombard, MD;  Location: [facility];  Service: Obstetrics;  Laterality: N/A;  . Cesarean section N/A 07/18/2015    Procedure: CESAREAN SECTION;  Surgeon: Woodroe Mode, MD;  Location: [facility];  Service: Obstetrics;   Laterality: N/A;   Family History  Problem Relation Age of Onset  . Diabetes Father   . Diabetes Maternal Grandmother   . Cancer Maternal Aunt     breast  . Cancer Paternal Grandmother    Social History  Substance Use Topics  . Smoking status: Never Smoker   . Smokeless tobacco: Never Used  . Alcohol Use: 0.0 oz/week    0 Standard drinks or equivalent per week     Comment: rare   OB History    Gravida Para Term Preterm AB TAB SAB Ectopic Multiple Living   2 2 2       0 2      Obstetric Comments   Pilar Plate Breech     Review of Systems  Constitutional: Negative for fever, chills and diaphoresis.  Respiratory: Positive for shortness of breath.   Cardiovascular: Positive for chest pain.  Gastrointestinal: Positive for abdominal pain. Negative for diarrhea.  Musculoskeletal: Negative for myalgias.  All other systems reviewed and are negative.  Allergies  Naproxen and Penicillins  Home Medications   Prior to Admission medications   Medication Sig Start Date End Date Taking? Authorizing Provider  Ascorbic Acid (VITAMIN C PO) Take by mouth.    Historical Provider, MD  ibuprofen (ADVIL,MOTRIN) 800 MG tablet Take 1 tablet (800 mg total) by mouth 3 (three) times daily. [id number]   Delora Fuel, MD  Multiple Vitamins-Minerals (MULTIVITAMIN WITH MINERALS) tablet Take 1 tablet by mouth daily.  Historical Provider, MD  norgestimate-ethinyl estradiol (ORTHO-CYCLEN,SPRINTEC,PREVIFEM) 0.25-35 MG-MCG tablet Take 1 tablet by mouth daily. 09/25/15   Seabron Spates, CNM  oxyCODONE-acetaminophen (PERCOCET) 5-325 MG tablet Take 1 tablet by mouth every 4 (four) hours as needed for moderate pain. [id number]   Delora Fuel, MD   BP 99991111 mmHg  Pulse 82  Temp(Src) 98.9 F (37.2 C) (Oral)  Resp 28  Ht 5\' 5"  (1.651 m)  Wt 157 lb (71.215 kg)  BMI 26.13 kg/m2  SpO2 100%  LMP 02/18/2016 Physical Exam  Constitutional: She is oriented to person, place, and time. She appears well-developed and  well-nourished.  HENT:  Head: Normocephalic and atraumatic.  Eyes: EOM are normal. Pupils are equal, round, and reactive to light.  Neck: Normal range of motion. Neck supple. No JVD present.  Cardiovascular: Normal rate, regular rhythm and normal heart sounds.   No murmur heard. Pulmonary/Chest: Effort normal and breath sounds normal. No respiratory distress. She exhibits tenderness.  Mild tenderness to left para-sternal area.  Abdominal: Soft. Bowel sounds are normal. She exhibits no distension and no mass. There is no tenderness.  Musculoskeletal: Normal range of motion. She exhibits no edema.  Lymphadenopathy:    She has no cervical adenopathy.  Neurological: She is alert and oriented to person, place, and time. No cranial nerve deficit. She exhibits normal muscle tone. Coordination normal.  Skin: Skin is warm and dry. No rash noted.  Psychiatric: She has a normal mood and affect. Judgment and thought content normal.  Nursing note and vitals reviewed.   ED Course  Procedures (including critical care time) DIAGNOSTIC STUDIES: Oxygen Saturation is 100% on RA, normal by my interpretation.    COORDINATION OF CARE: 12:32 AM-Discussed treatment plan which includes order of Ibuprofen with pt at bedside and pt agreed to plan.   Labs Review Results for orders placed or performed during the hospital encounter of [id number]  Basic metabolic panel  Result Value Ref Range   Sodium 136 135 - 145 mmol/L   Potassium 3.7 3.5 - 5.1 mmol/L   Chloride 102 101 - 111 mmol/L   CO2 26 22 - 32 mmol/L   Glucose, Bld 96 65 - 99 mg/dL   BUN 6 6 - 20 mg/dL   Creatinine, Ser 0.79 0.44 - 1.00 mg/dL   Calcium 9.7 8.9 - 10.3 mg/dL   GFR calc non Af Amer >60 >60 mL/Kim   GFR calc Af Amer >60 >60 mL/Kim   Anion gap 8 5 - 15  CBC  Result Value Ref Range   WBC 9.4 4.0 - 10.5 K/uL   RBC 4.44 3.87 - 5.11 MIL/uL   Hemoglobin 14.0 12.0 - 15.0 g/dL   HCT 40.8 36.0 - 46.0 %   MCV 91.9 78.0 - 100.0 fL   MCH  31.5 26.0 - 34.0 pg   MCHC 34.3 30.0 - 36.0 g/dL   RDW 12.8 11.5 - 15.5 %   Platelets 302 150 - 400 K/uL  I-stat troponin, ED  Result Value Ref Range   Troponin i, poc 0.00 0.00 - 0.08 ng/mL   Comment 3           Imaging Review Dg Chest 2 View  03/03/2016  CLINICAL DATA:  30 year old female with left-sided chest pain EXAM: CHEST  2 VIEW COMPARISON:  None. FINDINGS: The heart size and mediastinal contours are within normal limits. Both lungs are clear. The visualized skeletal structures are unremarkable. IMPRESSION: No active cardiopulmonary disease. Electronically Signed   By: Milas Hock  Radparvar M.D.   On: 03/03/2016 20:46   I have personally reviewed and evaluated these images and lab results as part of my medical decision-making.   EKG Interpretation   Date/Time:  Wednesday March 03 2016 19:16:08 EDT Ventricular Rate:  94 PR Interval:  146 QRS Duration: 74 QT Interval:  336 QTC Calculation: 420 R Axis:   66 Text Interpretation:  Normal sinus rhythm with sinus arrhythmia Normal ECG  No old tracing to compare Confirmed by [facility]  MD, Tamyrah Burbage ([id number]) on  03/04/2016 12:01:18 AM      MDM   Final diagnoses:  Chest wall pain    Pleuritic chest pain with some chest wall tenderness. Old records are reviewed and she has no relevant past visits. Her only risk factor for pulmonary embolism is use of oral contraceptives. She has normal heart rate with normal respirations and normal blood pressure and excellent oxygen saturation. I do not feel she needs screening for pulmonary embolism. No ECG changes and no rub to suggest pericarditis. Of note, she is allergic to naproxen but states that she has taken ibuprofen without difficulty. She will be treated for chest wall pain and is given prescription for ibuprofen and oxycodone have acetaminophen. Referred to PCP for follow-up.  I personally performed the services described in this documentation, which was scribed in my presence. The recorded  information has been reviewed and is accurate.      Delora Fuel, MD [id number]

## 2016-03-04 NOTE — Discharge Instructions (Signed)
Chest Wall Pain Chest wall pain is pain in or around the bones and muscles of your chest. Sometimes, an injury causes this pain. Sometimes, the cause may not be known. This pain may take several weeks or longer to get better. HOME CARE INSTRUCTIONS  Pay attention to any changes in your symptoms. Take these actions to help with your pain:   Rest as told by your health care provider.   Avoid activities that cause pain. These include any activities that use your chest muscles or your abdominal and side muscles to lift heavy items.   If directed, apply ice to the painful area:  Put ice in a plastic bag.  Place a towel between your skin and the bag.  Leave the ice on for 20 minutes, 2-3 times per day.  Take over-the-counter and prescription medicines only as told by your health care provider.  Do not use tobacco products, including cigarettes, chewing tobacco, and e-cigarettes. If you need help quitting, ask your health care provider.  Keep all follow-up visits as told by your health care provider. This is important. SEEK MEDICAL CARE IF:  You have a fever.  Your chest pain becomes worse.  You have new symptoms. SEEK IMMEDIATE MEDICAL CARE IF:  You have nausea or vomiting.  You feel sweaty or light-headed.  You have a cough with phlegm (sputum) or you cough up blood.  You develop shortness of breath.   This information is not intended to replace advice given to you by your health care provider. Make sure you discuss any questions you have with your health care provider.   Document Released: 08/09/2005 Document Revised: 04/30/2015 Document Reviewed: 11/04/2014 Elsevier Interactive Patient Education 2016 [employer].  Ibuprofen tablets and capsules What is this medicine? IBUPROFEN (eye BYOO proe fen) is a non-steroidal anti-inflammatory drug (NSAID). It is used for dental pain, fever, headaches or migraines, osteoarthritis, rheumatoid arthritis, or painful monthly periods.  It can also relieve minor aches and pains caused by a cold, flu, or sore throat. This medicine may be used for other purposes; ask your health care provider or pharmacist if you have questions. What should I tell my health care provider before I take this medicine? They need to know if you have any of these conditions: -asthma -cigarette smoker -drink more than 3 alcohol containing drinks a day -heart disease or circulation problems such as heart failure or leg edema (fluid retention) -high blood pressure -kidney disease -liver disease -stomach bleeding or ulcers -an unusual or allergic reaction to ibuprofen, aspirin, other NSAIDS, other medicines, foods, dyes, or preservatives -pregnant or trying to get pregnant -breast-feeding How should I use this medicine? Take this medicine by mouth with a glass of water. Follow the directions on the prescription label. Take this medicine with food if your stomach gets upset. Try to not lie down for at least 10 minutes after you take the medicine. Take your medicine at regular intervals. Do not take your medicine more often than directed. A special MedGuide will be given to you by the pharmacist with each prescription and refill. Be sure to read this information carefully each time. Talk to your pediatrician regarding the use of this medicine in children. Special care may be needed. Overdosage: If you think you have taken too much of this medicine contact a poison control center or emergency room at once. NOTE: This medicine is only for you. Do not share this medicine with others. What if I miss a dose? If you miss  a dose, take it as soon as you can. If it is almost time for your next dose, take only that dose. Do not take double or extra doses. What may interact with this medicine? Do not take this medicine with any of the following medications: -cidofovir -ketorolac -methotrexate -pemetrexed This medicine may also interact with the following  medications: -alcohol -aspirin -diuretics -lithium -other drugs for inflammation like prednisone -warfarin This list may not describe all possible interactions. Give your health care provider a list of all the medicines, herbs, non-prescription drugs, or dietary supplements you use. Also tell them if you smoke, drink alcohol, or use illegal drugs. Some items may interact with your medicine. What should I watch for while using this medicine? Tell your doctor or healthcare professional if your symptoms do not start to get better or if they get worse. This medicine does not prevent heart attack or stroke. In fact, this medicine may increase the chance of a heart attack or stroke. The chance may increase with longer use of this medicine and in people who have heart disease. If you take aspirin to prevent heart attack or stroke, talk with your doctor or health care professional. Do not take other medicines that contain aspirin, ibuprofen, or naproxen with this medicine. Side effects such as stomach upset, nausea, or ulcers may be more likely to occur. Many medicines available without a prescription should not be taken with this medicine. This medicine can cause ulcers and bleeding in the stomach and intestines at any time during treatment. Ulcers and bleeding can happen without warning symptoms and can cause death. To reduce your risk, do not smoke cigarettes or drink alcohol while you are taking this medicine. You may get drowsy or dizzy. Do not drive, use machinery, or do anything that needs mental alertness until you know how this medicine affects you. Do not stand or sit up quickly, especially if you are an older patient. This reduces the risk of dizzy or fainting spells. This medicine can cause you to bleed more easily. Try to avoid damage to your teeth and gums when you brush or floss your teeth. This medicine may be used to treat migraines. If you take migraine medicines for 10 or more days a month,  your migraines may get worse. Keep a diary of headache days and medicine use. Contact your healthcare professional if your migraine attacks occur more frequently. What side effects may I notice from receiving this medicine? Side effects that you should report to your doctor or health care professional as soon as possible: -allergic reactions like skin rash, itching or hives, swelling of the face, lips, or tongue -severe stomach pain -signs and symptoms of bleeding such as bloody or black, tarry stools; red or dark-brown urine; spitting up blood or brown material that looks like coffee grounds; red spots on the skin; unusual bruising or bleeding from the eye, gums, or nose -signs and symptoms of a blood clot such as changes in vision; chest pain; severe, sudden headache; trouble speaking; sudden numbness or weakness of the face, arm, or leg -unexplained weight gain or swelling -unusually weak or tired -yellowing of eyes or skin Side effects that usually do not require medical attention (report to your doctor or health care professional if they continue or are bothersome): -bruising -diarrhea -dizziness, drowsiness -headache -nausea, vomiting This list may not describe all possible side effects. Call your doctor for medical advice about side effects. You may report side effects to FDA at 1-800-FDA-1088. Where  should I keep my medicine? Keep out of the reach of children. Store at room temperature between 15 and 30 degrees C (59 and 86 degrees F). Keep container tightly closed. Throw away any unused medicine after the expiration date. NOTE: This sheet is a summary. It may not cover all possible information. If you have questions about this medicine, talk to your doctor, pharmacist, or health care provider.    2016, Elsevier/Gold Standard. (2013-04-10 10:48:02)  Acetaminophen; Oxycodone tablets What is this medicine? ACETAMINOPHEN; OXYCODONE (a set a MEE noe fen; ox i KOE done) is a pain  reliever. It is used to treat moderate to severe pain. This medicine may be used for other purposes; ask your health care provider or pharmacist if you have questions. What should I tell my health care provider before I take this medicine? They need to know if you have any of these conditions: -brain tumor -Crohn's disease, inflammatory bowel disease, or ulcerative colitis -drug abuse or addiction -head injury -heart or circulation problems -if you often drink alcohol -kidney disease or problems going to the bathroom -liver disease -lung disease, asthma, or breathing problems -an unusual or allergic reaction to acetaminophen, oxycodone, other opioid analgesics, other medicines, foods, dyes, or preservatives -pregnant or trying to get pregnant -breast-feeding How should I use this medicine? Take this medicine by mouth with a full glass of water. Follow the directions on the prescription label. You can take it with or without food. If it upsets your stomach, take it with food. Take your medicine at regular intervals. Do not take it more often than directed. Talk to your pediatrician regarding the use of this medicine in children. Special care may be needed. Patients over 94 years old may have a stronger reaction and need a smaller dose. Overdosage: If you think you have taken too much of this medicine contact a poison control center or emergency room at once. NOTE: This medicine is only for you. Do not share this medicine with others. What if I miss a dose? If you miss a dose, take it as soon as you can. If it is almost time for your next dose, take only that dose. Do not take double or extra doses. What may interact with this medicine? -alcohol -antihistamines -barbiturates like amobarbital, butalbital, butabarbital, methohexital, pentobarbital, phenobarbital, thiopental, and secobarbital -benztropine -drugs for bladder problems like solifenacin, trospium, oxybutynin, tolterodine,  hyoscyamine, and methscopolamine -drugs for breathing problems like ipratropium and tiotropium -drugs for certain stomach or intestine problems like propantheline, homatropine methylbromide, glycopyrrolate, atropine, belladonna, and dicyclomine -general anesthetics like etomidate, ketamine, nitrous oxide, propofol, desflurane, enflurane, halothane, isoflurane, and sevoflurane -medicines for depression, anxiety, or psychotic disturbances -medicines for sleep -muscle relaxants -naltrexone -narcotic medicines (opiates) for pain -phenothiazines like perphenazine, thioridazine, chlorpromazine, mesoridazine, fluphenazine, prochlorperazine, promazine, and trifluoperazine -scopolamine -tramadol -trihexyphenidyl This list may not describe all possible interactions. Give your health care provider a list of all the medicines, herbs, non-prescription drugs, or dietary supplements you use. Also tell them if you smoke, drink alcohol, or use illegal drugs. Some items may interact with your medicine. What should I watch for while using this medicine? Tell your doctor or health care professional if your pain does not go away, if it gets worse, or if you have new or a different type of pain. You may develop tolerance to the medicine. Tolerance means that you will need a higher dose of the medication for pain relief. Tolerance is normal and is expected if you take this medicine  for a long time. Do not suddenly stop taking your medicine because you may develop a severe reaction. Your body becomes used to the medicine. This does NOT mean you are addicted. Addiction is a behavior related to getting and using a drug for a non-medical reason. If you have pain, you have a medical reason to take pain medicine. Your doctor will tell you how much medicine to take. If your doctor wants you to stop the medicine, the dose will be slowly lowered over time to avoid any side effects. You may get drowsy or dizzy. Do not drive, use  machinery, or do anything that needs mental alertness until you know how this medicine affects you. Do not stand or sit up quickly, especially if you are an older patient. This reduces the risk of dizzy or fainting spells. Alcohol may interfere with the effect of this medicine. Avoid alcoholic drinks. There are different types of narcotic medicines (opiates) for pain. If you take more than one type at the same time, you may have more side effects. Give your health care provider a list of all medicines you use. Your doctor will tell you how much medicine to take. Do not take more medicine than directed. Call emergency for help if you have problems breathing. The medicine will cause constipation. Try to have a bowel movement at least every 2 to 3 days. If you do not have a bowel movement for 3 days, call your doctor or health care professional. Do not take Tylenol (acetaminophen) or medicines that have acetaminophen with this medicine. Too much acetaminophen can be very dangerous. Many nonprescription medicines contain acetaminophen. Always read the labels carefully to avoid taking more acetaminophen. What side effects may I notice from receiving this medicine? Side effects that you should report to your doctor or health care professional as soon as possible: -allergic reactions like skin rash, itching or hives, swelling of the face, lips, or tongue -breathing difficulties, wheezing -confusion -light headedness or fainting spells -severe stomach pain -unusually weak or tired -yellowing of the skin or the whites of the eyes Side effects that usually do not require medical attention (report to your doctor or health care professional if they continue or are bothersome): -dizziness -drowsiness -nausea -vomiting This list may not describe all possible side effects. Call your doctor for medical advice about side effects. You may report side effects to FDA at 1-800-FDA-1088. Where should I keep my  medicine? Keep out of the reach of children. This medicine can be abused. Keep your medicine in a safe place to protect it from theft. Do not share this medicine with anyone. Selling or giving away this medicine is dangerous and against the law. This medicine may cause accidental overdose and death if it taken by other adults, children, or pets. Mix any unused medicine with a substance like cat litter or coffee grounds. Then throw the medicine away in a sealed container like a sealed bag or a coffee can with a lid. Do not use the medicine after the expiration date. Store at room temperature between 20 and 25 degrees C (68 and 77 degrees F). NOTE: This sheet is a summary. It may not cover all possible information. If you have questions about this medicine, talk to your doctor, pharmacist, or health care provider.    2016, Elsevier/Gold Standard. (2014-07-10 15:18:46)

## 2016-03-04 NOTE — Patient Instructions (Signed)
Natural Family Planning Natural Family Planning (NFP) is a type of birth control without using any form of contraception. Women who use NFP should not have sexual intercourse when the ovary produces an egg (ovulation) during the menstrual cycle. The NFP method is safe and can prevent pregnancy. It is 75% effective when practiced right. The man needs to also understand this method of birth control and the woman needs to be aware of how her body functions during her menstrual cycle. NFP can also be used as a method of getting pregnant.  HOW THE NFP METHOD WORKS  A woman's menstrual period usually happens every 28-30 days (it can vary from 23-35 days).  Ovulation happens 12-14 days before the start of the next menstrual period (the fertile period). The egg is fertile for 24 hours and the sperm can live for 3 days or more. If there is sexual intercourse at this time, pregnancy can occur. THERE ARE MANY TYPES OF NFP METHODS USED TO PREVENT PREGNANCY  The basal body temperature method. Often times, there is a slight increase of body temperature when a woman ovulates. Take your temperature every morning before getting out of bed. Write the temperature on a chart. An increase in the temperature shows ovulation has happened. Do not have sexual intercourse from the menstrual period up to three days after the increase in the temperature. Note that the body temperature may increase as a result of fever, restless sleep, and working schedules.  The ovulation cervical mucus method. During the menstrual cycle, the cervical mucus changes from dry and sticky to wet and slippery. Check the mucus of the vagina every day to look for these changes. Just before ovulation, the mucus becomes wet and slippery. On the last day of wetness, ovulation happens. To avoid getting pregnant, sexual intercourse is safe for about 10 days after the menstrual period and on the dry mucus days. Do not have sexual intercourse when the mucus  starts to show up and not until 4 days after the wet and slippery mucus goes away. Sexual intercourse after the 4 days have passed until the menstrual period starts is a safe time. Note that the mucus from the vagina can increase because of a vaginal or cervical infection, lubricants, some medicines, and sexual excitement.  The symptothermal method. This method uses both the temperature and the ovulation methods. Combine the two methods above to prevent pregnancy.  The calendar method. Record your menstrual periods and length of the cycles for 6 months. This is helpful when the menstrual cycle varies in the length of the cycle. The length of a menstrual cycle is from day 1 of the present menstrual period to day 1 of the next menstrual period. Then, find your fertile days of the month and do not have sexual intercourse during that time. You may need help from your health care provider to find out your fertile days. There are some signs of ovulation that may be helpful when trying to find the time of ovulation. This includes vaginal spotting or abdominal cramps during the middle of your menstrual cycle. Not all women have these symptoms. YOU SHOULD NOT USE NFP IF:  You have very irregular menstrual periods and may skip months.  You have abnormal bleeding.  You have a vaginal or cervical infection.  You are on medicines that can affect the vaginal mucus or body temperature. These medicines include antibiotics, thyroid medicines, and antihistamines (cold and allergy medicine).   This information is not intended to replace  advice given to you by your health care provider. Make sure you discuss any questions you have with your health care provider.   Document Released: 01/26/2008 Document Revised: 08/14/2013 Document Reviewed: 02/09/2013 Elsevier Interactive Patient Education [employer].

## 2016-03-05 LAB — CYTOLOGY - PAP

## 2016-03-08 ENCOUNTER — Telehealth: Payer: Self-pay

## 2016-03-08 NOTE — Telephone Encounter (Signed)
Sent message in my chart and Left message for patient to return call to back to office if she has any further questions.

## 2016-03-08 NOTE — Telephone Encounter (Signed)
-----   Message from Lavonia Drafts, MD sent at 03/08/2016 11:11 AM EDT ----- Please call pt. Her PAP was neg but her hrHPV was +.  She needs a repeat PAP in 1 year.  Thx, clh-S

## 2016-03-09 LAB — CERVICOVAGINAL ANCILLARY ONLY
Bacterial vaginitis: NEGATIVE
Candida vaginitis: NEGATIVE

## 2016-03-11 ENCOUNTER — Ambulatory Visit: Payer: Medicaid Other | Admitting: Obstetrics & Gynecology

## 2016-04-12 ENCOUNTER — Ambulatory Visit: Payer: Medicaid Other | Admitting: Family Medicine

## 2016-04-13 ENCOUNTER — Encounter: Payer: Self-pay | Admitting: Family Medicine

## 2016-04-13 ENCOUNTER — Ambulatory Visit (INDEPENDENT_AMBULATORY_CARE_PROVIDER_SITE_OTHER): Payer: Medicaid Other | Admitting: Family Medicine

## 2016-04-13 VITALS — BP 101/77 | HR 85 | Temp 98.5°F | Resp 18 | Ht 65.0 in | Wt 154.0 lb

## 2016-04-13 DIAGNOSIS — Z131 Encounter for screening for diabetes mellitus: Secondary | ICD-10-CM

## 2016-04-13 LAB — POCT GLYCOSYLATED HEMOGLOBIN (HGB A1C): HEMOGLOBIN A1C: 5

## 2016-04-13 NOTE — Progress Notes (Signed)
Patient is here for FU  Patient denies pain at this time  Patient has not taken medication today. Patient has eaten today.

## 2016-04-14 NOTE — Patient Instructions (Signed)
Follow-up in one year and as needed.

## 2016-04-14 NOTE — Progress Notes (Signed)
Kim [name], is a 30 y.o. female  [id number]  [id number]  DOB - 11/01/1985  CC:  Chief Complaint  Patient presents with  . Follow-up       HPI: Kim [name] is a 30 y.o. female here for routine follow-up. She has no specific complaints today. We are prescribing no medications for her. She is on [facility] per her GYN. She was seen in ED in mid July for chest wall pain which has resolved. She has been referred to opthalmology for possible elevated eye pressures.  She has a history of Fibroids. GERD, HPV. She reports she is having no problems with any of these conditions.  Health Maintenance: She declines flu vaccine today. Other items are UTD.She does need an A1C for diabetic screening.  Allergies  Allergen Reactions  . Naproxen Anaphylaxis    Can take ibuprofen  . Penicillins Other (See Comments)    Family history of allergy, patient prefers to not take this medication Has patient had a PCN reaction causing immediate rash, facial/tongue/throat swelling, SOB or lightheadedness with hypotension: No Has patient had a PCN reaction causing severe rash involving mucus membranes or skin necrosis: No Has patient had a PCN reaction that required hospitalization No Has patient had a PCN reaction occurring within the last 10 years: No If all of the above answers are "NO", then may proceed with Cephalospo   Past Medical History:  Diagnosis Date  . 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 Fibroid    1.9cm  . GERD (gastroesophageal reflux disease)   . HPV (human papilloma virus) infection   . Vaginal Pap smear, abnormal    Current Outpatient Prescriptions on File Prior to Visit  Medication Sig Dispense Refill  . Ascorbic Acid (VITAMIN C PO) Take by mouth. Reported on 03/04/2016    . Multiple Vitamins-Minerals (MULTIVITAMIN WITH MINERALS) tablet Take 1 tablet by mouth daily.    Marland Kitchen ibuprofen (ADVIL,MOTRIN) 800 MG tablet Take 1 tablet (800 mg total) by mouth 3 (three) times daily. (Patient not  taking: Reported on 04/13/2016) 30 tablet 0  . norgestimate-ethinyl estradiol (ORTHO-CYCLEN,SPRINTEC,PREVIFEM) 0.25-35 MG-MCG tablet Take 1 tablet by mouth daily. (Patient not taking: Reported on 04/13/2016) 1 Package 11  . oxyCODONE-acetaminophen (PERCOCET) 5-325 MG tablet Take 1 tablet by mouth every 4 (four) hours as needed for moderate pain. (Patient not taking: Reported on 04/13/2016) 15 tablet 0  . [DISCONTINUED] cetirizine (ZYRTEC) 10 MG tablet Take 1 tablet (10 mg total) by mouth daily. (Patient not taking: Reported on 02/26/2016) 30 tablet 0  . [DISCONTINUED] fluticasone (FLONASE) 50 MCG/ACT nasal spray Place 2 sprays into both nostrils daily. (Patient not taking: Reported on 02/26/2016) 16 g 0   No current facility-administered medications on file prior to visit.    Family History  Problem Relation Age of Onset  . Diabetes Father   . Diabetes Maternal Grandmother   . Cancer Maternal Aunt     breast  . Cancer Paternal Grandmother    Social History   Social History  . Marital status: Married    Spouse name: N/A  . Number of children: N/A  . Years of education: N/A   Occupational History  . [employer]    Social History Main Topics  . Smoking status: Never Smoker  . Smokeless tobacco: Never Used  . Alcohol use 0.0 oz/week     Comment: rare  . Drug use: No  . Sexual activity: Not Currently    Partners: Male    Birth control/ protection: Pill  Comment: Oct 28 2014   Other Topics Concern  . Not on file   Social History Narrative  . No narrative on file    Review of Systems: Constitutional: Negative for fever, chills, appetite change, weight loss,  Fatigue. Skin: Negative for rashes or lesions of concern. HENT: Negative for ear pain, ear discharge.nose bleeds Eyes: Negative for pain, discharge, redness, itching and visual disturbance. Neck: Negative for pain, stiffness Respiratory: Negative for cough, shortness of breath,   Cardiovascular: Negative for chest pain,  palpitations and leg swelling. Gastrointestinal: Negative for abdominal pain, nausea, vomiting, diarrhea, constipations Genitourinary: Negative for dysuria, urgency, frequency, hematuria,  Musculoskeletal: Negative for back pain, joint pain, joint  swelling, and gait problem.Negative for weakness. Neurological: Negative for dizziness, tremors, seizures, syncope,   light-headedness, numbness and headaches.  Hematological: Negative for easy bruising or bleeding Psychiatric/Behavioral: Negative for depression, anxiety, decreased concentration, confusion   Objective:   Vitals:   04/13/16 1325  BP: 101/77  Pulse: 85  Resp: 18  Temp: 98.5 F (36.9 C)    Physical Exam: Constitutional: Patient appears well-developed and well-nourished. No distress. HENT: Normocephalic, atraumatic, External right and left ear normal. Oropharynx is clear and moist.  Eyes: Conjunctivae and EOM are normal. PERRLA, no scleral icterus. Neck: Normal ROM. Neck supple. No lymphadenopathy, No thyromegaly. CVS: RRR, S1/S2 +, no murmurs, no gallops, no rubs Pulmonary: Effort and breath sounds normal, no stridor, rhonchi, wheezes, rales.  Abdominal: Soft. Normoactive BS,, no distension, tenderness, rebound or guarding.  Musculoskeletal: Normal range of motion. No edema and no tenderness.  Neuro: Alert.Normal muscle tone coordination. Non-focal Skin: Skin is warm and dry. No rash noted. Not diaphoretic. No erythema. No pallor. Psychiatric: Normal mood and affect. Behavior, judgment, thought content normal.  Lab Results  Component Value Date   WBC 9.4 03/03/2016   HGB 14.0 03/03/2016   HCT 40.8 03/03/2016   MCV 91.9 03/03/2016   PLT 302 03/03/2016   Lab Results  Component Value Date   CREATININE 0.79 03/03/2016   BUN 6 03/03/2016   NA 136 03/03/2016   K 3.7 03/03/2016   CL 102 03/03/2016   CO2 26 03/03/2016    Lab Results  Component Value Date   HGBA1C 5.0 04/13/2016   Lipid Panel  No results found  for: CHOL, TRIG, HDL, CHOLHDL, VLDL, LDLCALC     Assessment and plan:   1. Screening for diabetes mellitus  - POCT glycosylated hemoglobin (Hb A1C)   Follow-up one year and prn.  The patient was given clear instructions to go to ER or return to medical center if symptoms don't improve, worsen or new problems develop. The patient verbalized understanding.    Micheline Chapman FNP  04/14/2016, 1:45 PM

## 2016-07-26 ENCOUNTER — Ambulatory Visit: Payer: Medicaid Other | Admitting: Family Medicine

## 2017-06-01 IMAGING — US US PELVIS COMPLETE
1 series · 14 of 25 positions shown · non-contrast
Comparison: None

CLINICAL DATA: Abnormal uterine bleeding



[Series 1: us pelvis complete · 0.24mm/px · 14 of 45 slices shown]
[im 1/45]
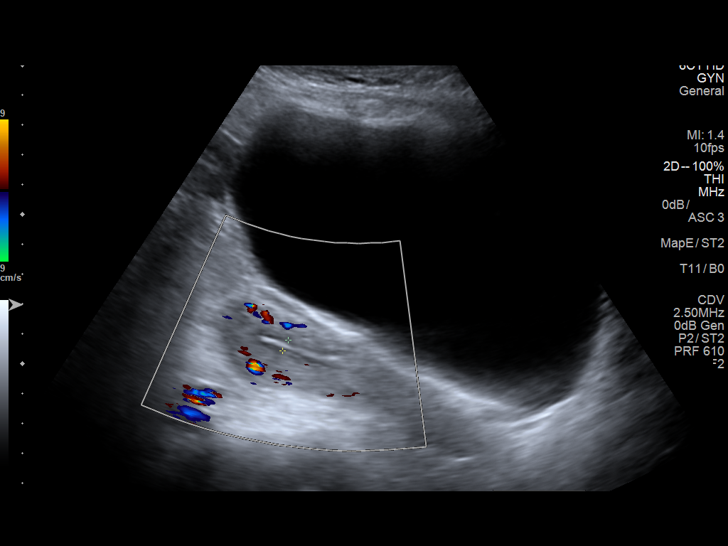
[im 4/45]
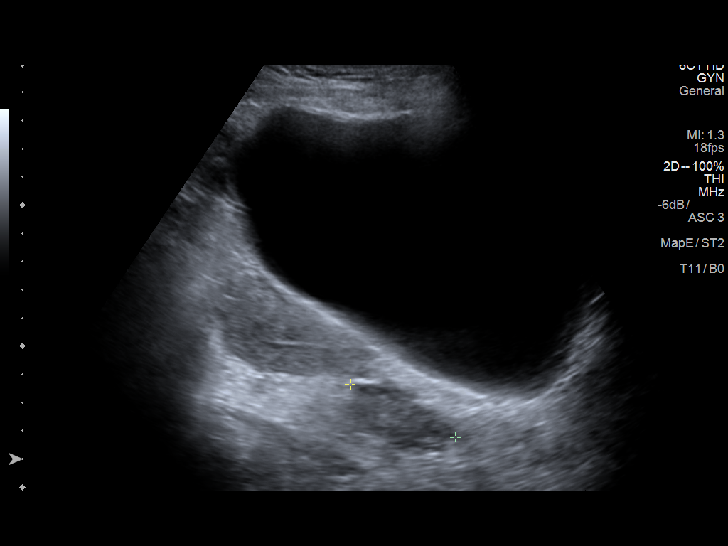
[im 8/45]
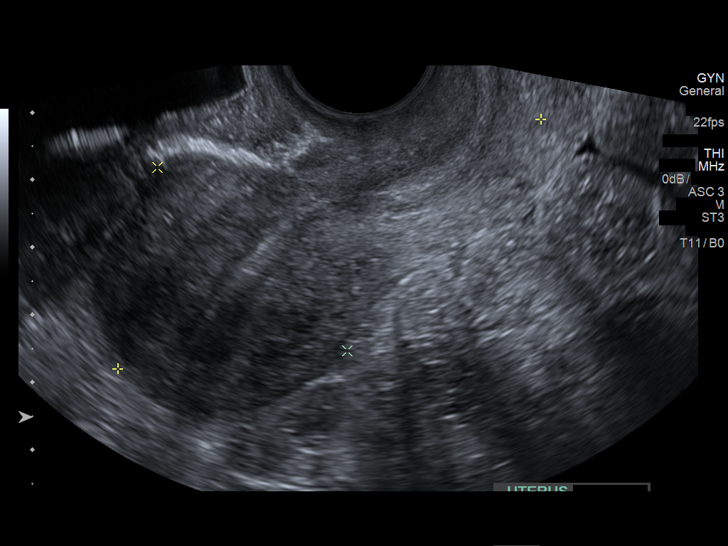
[im 12/45]
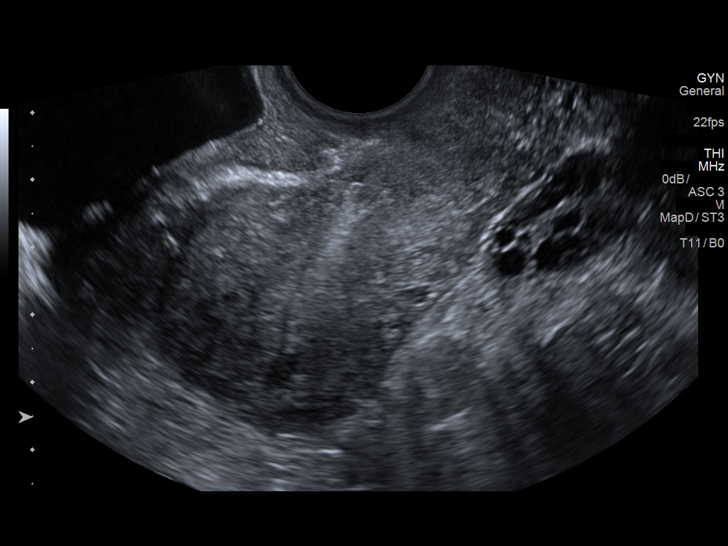
[im 15/45]
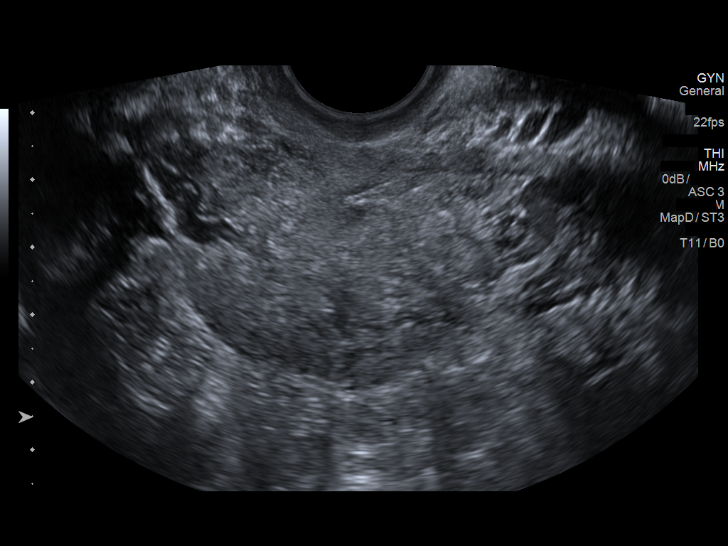
[im 17/45]
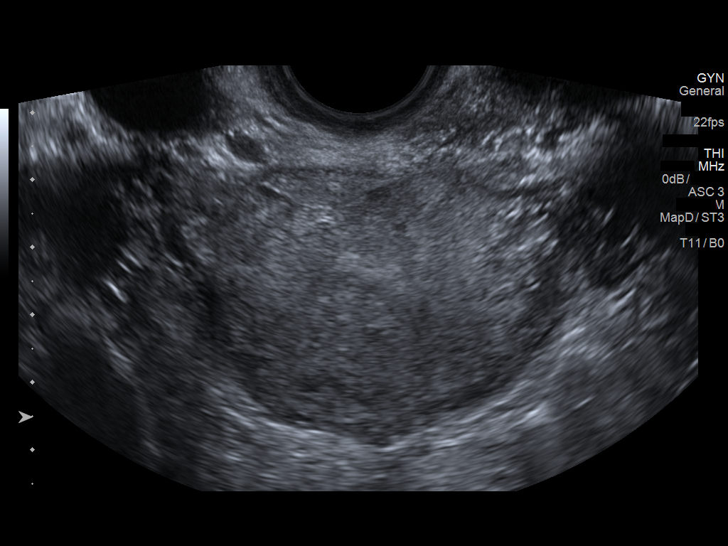
[im 21/45]
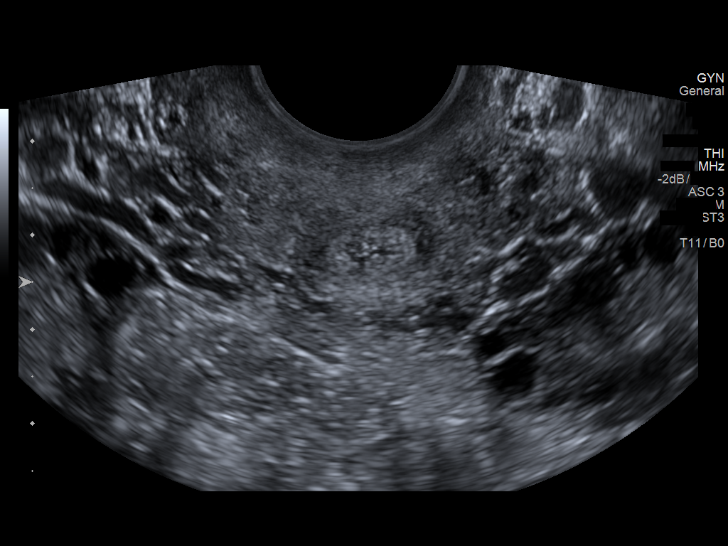
[im 24/45]
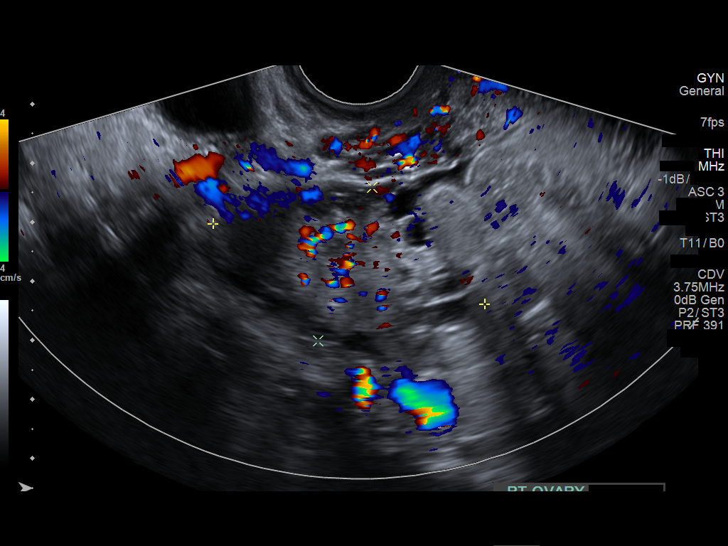
[im 28/45]
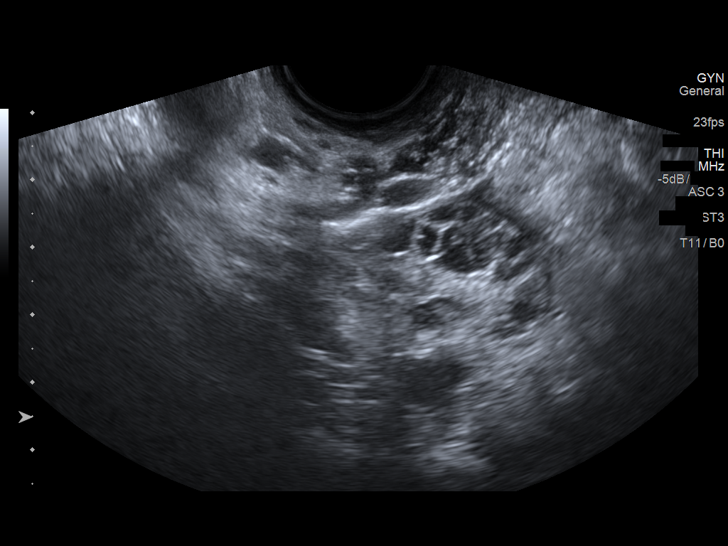
[im 30/45]
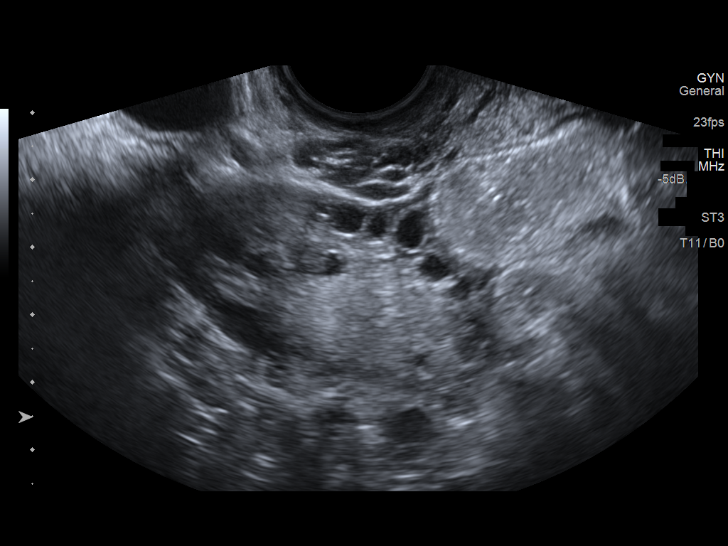
[im 34/45]
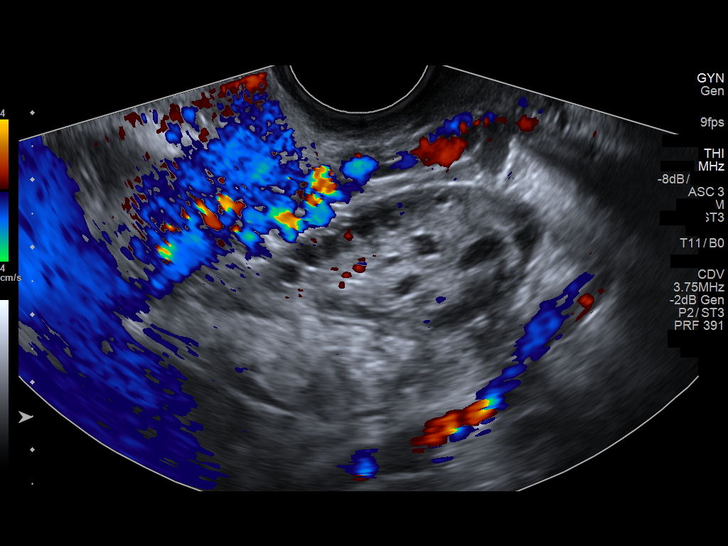
[im 37/45]
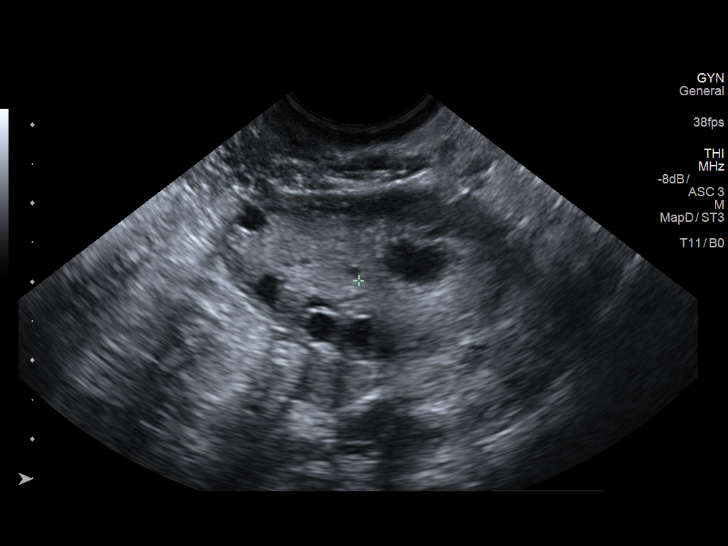
[im 41/45]
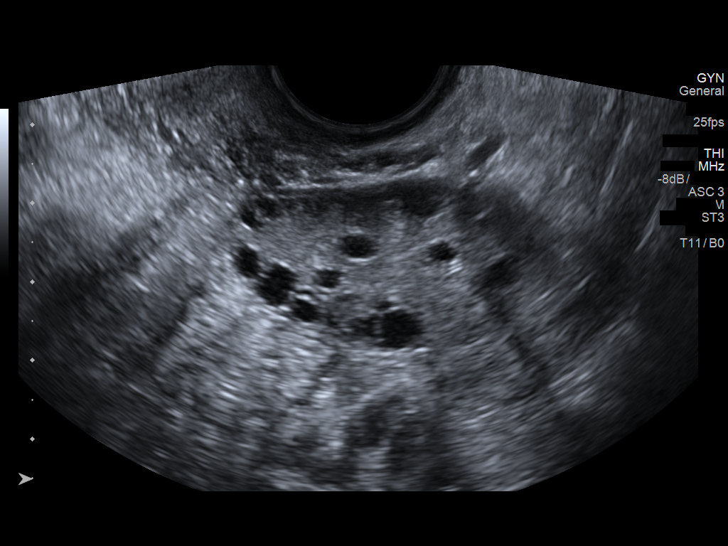
[im 45/45]
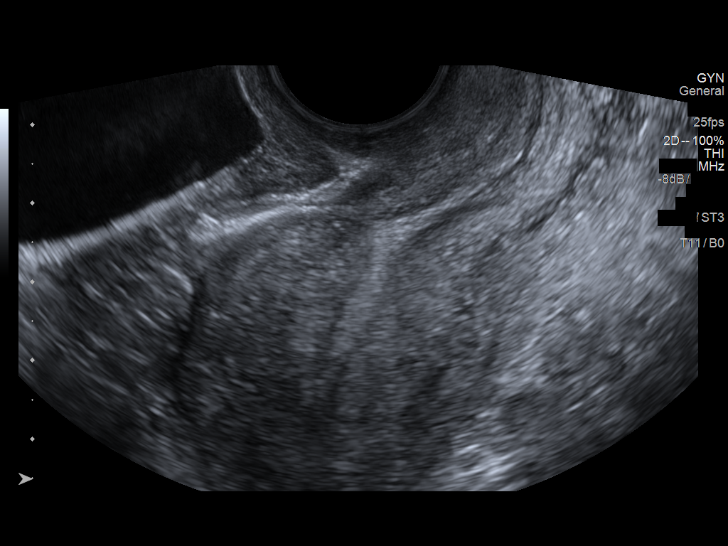

[14 of 25 positions shown; findings below may reference images not displayed]

FINDINGS: Uterus

Measurements: 7.3 x 4.0 x 4.8 cm. No fibroids or other mass
visualized.

Endometrium

Thickness: 5 mm in thickness.  No focal abnormality visualized.

Right ovary

Measurements: 4.8 x 2.8 x 2.1 cm. Multiple small follicles. Normal
appearance/no adnexal mass.

Left ovary

Measurements: 4.1 x 2.1 x 3.7 cm. Multiple small follicles. Normal
appearance/no adnexal mass.

Other findings

No abnormal free fluid.
IMPRESSION: No acute or significant abnormality.
